# Patient Record
Sex: Male | Born: 1975 | Race: White | Hispanic: Yes | Marital: Married | Smoking: Former smoker
Health system: Southern US, Community
[De-identification: ages and names within clinical notes are randomized; demographics above are authoritative.]

## PROBLEM LIST (undated history)

## (undated) DIAGNOSIS — M109 Gout, unspecified: Secondary | ICD-10-CM

## (undated) DIAGNOSIS — F411 Generalized anxiety disorder: Secondary | ICD-10-CM

## (undated) DIAGNOSIS — E785 Hyperlipidemia, unspecified: Secondary | ICD-10-CM

## (undated) HISTORY — DX: Hyperlipidemia, unspecified: E78.5

## (undated) HISTORY — DX: Generalized anxiety disorder: F41.1

## (undated) HISTORY — DX: Gout, unspecified: M10.9

---

## 1999-11-27 HISTORY — PX: NASAL RECONSTRUCTION: SHX2069

## 2021-03-06 ENCOUNTER — Other Ambulatory Visit: Payer: Self-pay

## 2021-03-06 ENCOUNTER — Ambulatory Visit (INDEPENDENT_AMBULATORY_CARE_PROVIDER_SITE_OTHER): Payer: BC Managed Care – PPO | Admitting: Family Medicine

## 2021-03-06 ENCOUNTER — Encounter: Payer: Self-pay | Admitting: Family Medicine

## 2021-03-06 VITALS — BP 128/84 | HR 59 | Temp 98.4°F | Resp 16 | Ht 76.0 in | Wt 270.0 lb

## 2021-03-06 DIAGNOSIS — Z1211 Encounter for screening for malignant neoplasm of colon: Secondary | ICD-10-CM

## 2021-03-06 DIAGNOSIS — F411 Generalized anxiety disorder: Secondary | ICD-10-CM | POA: Diagnosis not present

## 2021-03-06 DIAGNOSIS — M109 Gout, unspecified: Secondary | ICD-10-CM | POA: Insufficient documentation

## 2021-03-06 DIAGNOSIS — F321 Major depressive disorder, single episode, moderate: Secondary | ICD-10-CM | POA: Diagnosis not present

## 2021-03-06 DIAGNOSIS — L409 Psoriasis, unspecified: Secondary | ICD-10-CM | POA: Diagnosis not present

## 2021-03-06 MED ORDER — TRIAMCINOLONE ACETONIDE 0.1 % EX CREA: 1.0000 "application " | TOPICAL_CREAM | Freq: Two times a day (BID) | CUTANEOUS | 0 refills | Status: DC

## 2021-03-06 MED ORDER — ESCITALOPRAM OXALATE 10 MG PO TABS: 10.0000 mg | ORAL_TABLET | Freq: Every day | ORAL | 3 refills | Status: DC

## 2021-03-06 NOTE — Patient Instructions (Addendum)
If you do not hear anything about your referral in the next 1-2 weeks, call our office and ask for an update.  Please consider counseling. Contact [phone] to schedule an appointment or inquire about cost/insurance coverage.  Aim to do some physical exertion for 150 minutes per week. This is typically divided into 5 days per week, 30 minutes per day. The activity should be enough to get your heart rate up. Anything is better than nothing if you have time constraints.  Coping skills Choose 5 that work for you:  Take a deep breath  Count to 20  Read a book  Do a puzzle  Meditate  Bake  Sing  Knit  Garden   Pray  Go outside  Call a friend  Listen to music  Take a walk  Color  Send a note   Take a bath  Watch a movie  Be alone in a quiet place  Pet an animal  Visit a friend  Journal  Exercise  Stretch

## 2021-03-06 NOTE — Progress Notes (Signed)
Chief Complaint  Patient presents with  . New Patient (Initial Visit)    Here to stablish        New Patient Visit SUBJECTIVE: HPI: Raymond Gamble is an 45 y.o.male who is being seen for establishing care.  The patient was previously seen in [place].  Anxiety/depression The patient has a longstanding history of anxiety/depression.  He has never been on any medication in the past.  He moved from [place] around 1 year ago.  His job is much less stressful.  He is not currently following with a psychologist but plans on starting.  His wife is a Engineer, water.  Exercise helps him tremendously.  Due to the diagnosis though, he has not felt motivated to exercise routinely.  He is the heaviest he has ever been.  He denies any suicidal or homicidal ideation.  No self-medication.  Past Medical History:  Diagnosis Date  . GAD (generalized anxiety disorder)   . Gout    Past Surgical History:  Procedure Laterality Date  . NASAL RECONSTRUCTION  2001   Family History  Problem Relation Age of Onset  . Depression Neg Hx    No Known Allergies  Current Outpatient Medications:  .  Ascorbic Acid (VITAMIN C) 1000 MG tablet, Take 1,000 mg by mouth daily., Disp: , Rfl:  .  Cholecalciferol (D3 ADULT PO), Take by mouth., Disp: , Rfl:  .  escitalopram (LEXAPRO) 10 MG tablet, Take 1 tablet (10 mg total) by mouth daily., Disp: 30 tablet, Rfl: 3 .  magnesium 30 MG tablet, Take 30 mg by mouth 2 (two) times daily., Disp: , Rfl:  .  Multiple Vitamin (MULTIVITAMIN) tablet, Take 1 tablet by mouth daily., Disp: , Rfl:   OBJECTIVE: BP 128/84 (BP Location: Right Arm, Patient Position: Sitting, Cuff Size: Large)   Pulse (!) 59   Temp 98.4 F (36.9 C) (Oral)   Resp 16   Ht 6\' 4"  (1.93 m)   Wt 270 lb (122.5 kg)   SpO2 98%   BMI 32.87 kg/m  General:  well developed, well nourished, in no apparent distress Skin: + Scaly patches of the wrist and behind the ears with erythematous base Nose:  nares patent, septum  midline, mucosa normal Throat/Pharynx:  lips and gingiva without lesion; tongue and uvula midline; non-inflamed pharynx; no exudates or postnasal drainage Lungs:  clear to auscultation, breath sounds equal bilaterally, no respiratory distress Cardio:  regular rate and rhythm, no LE edema or bruits Neuro:  gait normal Psych: well oriented with normal range of affect and appropriate judgment/insight  ASSESSMENT/PLAN: GAD (generalized anxiety disorder) - Plan: escitalopram (LEXAPRO) 10 MG tablet  Psoriasis - Plan: Ambulatory referral to Dermatology  Depression, major, single episode, moderate ([place]) - Plan: escitalopram (LEXAPRO) 10 MG tablet  Screen for colon cancer - Plan: Ambulatory referral to Gastroenterology  1/3.  Start Lexapro 10 mg daily.  Counseled on exercise.  Anxiety coping techniques verbalized and written down.  Behavioral health information verbalized and written down. 2.  He declined any topical medicines today.  Refer to dermatology for other therapies. Patient should return in 6 weeks for physical and to recheck 1/3. The patient voiced understanding and agreement to the plan.   [place], DO 03/06/21  4:48 PM

## 2021-04-19 ENCOUNTER — Other Ambulatory Visit: Payer: Self-pay | Admitting: Family Medicine

## 2021-04-19 ENCOUNTER — Ambulatory Visit (INDEPENDENT_AMBULATORY_CARE_PROVIDER_SITE_OTHER): Payer: BC Managed Care – PPO | Admitting: Family Medicine

## 2021-04-19 ENCOUNTER — Telehealth: Payer: Self-pay | Admitting: Family Medicine

## 2021-04-19 ENCOUNTER — Encounter: Payer: Self-pay | Admitting: Family Medicine

## 2021-04-19 ENCOUNTER — Other Ambulatory Visit (INDEPENDENT_AMBULATORY_CARE_PROVIDER_SITE_OTHER): Payer: BC Managed Care – PPO

## 2021-04-19 ENCOUNTER — Other Ambulatory Visit: Payer: Self-pay

## 2021-04-19 VITALS — BP 106/64 | HR 57 | Temp 98.1°F | Ht 76.0 in | Wt 277.0 lb

## 2021-04-19 DIAGNOSIS — Z0000 Encounter for general adult medical examination without abnormal findings: Secondary | ICD-10-CM | POA: Diagnosis not present

## 2021-04-19 DIAGNOSIS — Z125 Encounter for screening for malignant neoplasm of prostate: Secondary | ICD-10-CM | POA: Diagnosis not present

## 2021-04-19 DIAGNOSIS — F321 Major depressive disorder, single episode, moderate: Secondary | ICD-10-CM

## 2021-04-19 DIAGNOSIS — F411 Generalized anxiety disorder: Secondary | ICD-10-CM

## 2021-04-19 DIAGNOSIS — Z23 Encounter for immunization: Secondary | ICD-10-CM | POA: Diagnosis not present

## 2021-04-19 DIAGNOSIS — Z1159 Encounter for screening for other viral diseases: Secondary | ICD-10-CM | POA: Diagnosis not present

## 2021-04-19 DIAGNOSIS — E785 Hyperlipidemia, unspecified: Secondary | ICD-10-CM

## 2021-04-19 DIAGNOSIS — R739 Hyperglycemia, unspecified: Secondary | ICD-10-CM | POA: Diagnosis not present

## 2021-04-19 DIAGNOSIS — R748 Abnormal levels of other serum enzymes: Secondary | ICD-10-CM

## 2021-04-19 LAB — CBC
HCT: 44.2 % (ref 39.0–52.0)
Hemoglobin: 15.6 g/dL (ref 13.0–17.0)
MCHC: 35.3 g/dL (ref 30.0–36.0)
MCV: 89.1 fl (ref 78.0–100.0)
Platelets: 211 10*3/uL (ref 150.0–400.0)
RBC: 4.96 Mil/uL (ref 4.22–5.81)
RDW: 14.7 % (ref 11.5–15.5)
WBC: 4.8 10*3/uL (ref 4.0–10.5)

## 2021-04-19 LAB — LIPID PANEL
Cholesterol: 233 mg/dL — ABNORMAL HIGH (ref 0–200)
HDL: 29.6 mg/dL — ABNORMAL LOW (ref >39.00)
NonHDL: 203.86
Total CHOL/HDL Ratio: 8
Triglycerides: 388 mg/dL — ABNORMAL HIGH (ref 0.0–149.0)
VLDL: 77.6 mg/dL — ABNORMAL HIGH (ref 0.0–40.0)

## 2021-04-19 LAB — COMPREHENSIVE METABOLIC PANEL
ALT: 54 U/L — ABNORMAL HIGH (ref 0–53)
AST: 26 U/L (ref 0–37)
Albumin: 4.4 g/dL (ref 3.5–5.2)
Alkaline Phosphatase: 54 U/L (ref 39–117)
BUN: 14 mg/dL (ref 6–23)
CO2: 27 mEq/L (ref 19–32)
Calcium: 9.3 mg/dL (ref 8.4–10.5)
Chloride: 104 mEq/L (ref 96–112)
Creatinine, Ser: 1.07 mg/dL (ref 0.40–1.50)
GFR: 83.98 mL/min (ref >60.00)
Glucose, Bld: 113 mg/dL — ABNORMAL HIGH (ref 70–99)
Potassium: 4.3 mEq/L (ref 3.5–5.1)
Sodium: 138 mEq/L (ref 135–145)
Total Bilirubin: 0.6 mg/dL (ref 0.2–1.2)
Total Protein: 6.6 g/dL (ref 6.0–8.3)

## 2021-04-19 LAB — LDL CHOLESTEROL, DIRECT: Direct LDL: 144 mg/dL

## 2021-04-19 LAB — HEMOGLOBIN A1C: Hgb A1c MFr Bld: 5.2 % (ref 4.6–6.5)

## 2021-04-19 MED ORDER — ESCITALOPRAM OXALATE 10 MG PO TABS: 10.0000 mg | ORAL_TABLET | Freq: Every day | ORAL | 2 refills | Status: DC

## 2021-04-19 NOTE — Telephone Encounter (Signed)
See result notes. 

## 2021-04-19 NOTE — Progress Notes (Signed)
Chief Complaint  Patient presents with  . Annual Exam    Well Male Raymond Gamble is here for a complete physical.   His last physical was >1 year ago.  Current diet: in general, diet is fair.  "Dirty keto" diet.  Current exercise: cardio, lifting Weight trend: has increased Fatigue out of ordinary? No.  Seat belt? Yes.    Health maintenance  Tetanus- No HIV- Yes Hep C- No  Past Medical History:  Diagnosis Date  . GAD (generalized anxiety disorder)   . Gout      Past Surgical History:  Procedure Laterality Date  . NASAL RECONSTRUCTION  2001    Medications  Current Outpatient Medications on File Prior to Visit  Medication Sig Dispense Refill  . Cholecalciferol (D3 ADULT PO) Take by mouth.    . escitalopram (LEXAPRO) 10 MG tablet Take 1 tablet (10 mg total) by mouth daily. 30 tablet 3  . magnesium 30 MG tablet Take 30 mg by mouth 2 (two) times daily.    . Multiple Vitamin (MULTIVITAMIN) tablet Take 1 tablet by mouth daily.     Allergies No Known Allergies  Family History Family History  Problem Relation Age of Onset  . Depression Neg Hx     Review of Systems: Constitutional: no fevers or chills Eye:  no recent significant change in vision Ear/Nose/Mouth/Throat:  Ears:  no hearing loss Nose/Mouth/Throat:  no complaints of nasal congestion, no sore throat Cardiovascular:  no chest pain Respiratory:  no shortness of breath Gastrointestinal:  no abdominal pain, no change in bowel habits GU:  Male: negative for dysuria, frequency, and incontinence Musculoskeletal/Extremities:  no pain of the joints Integumentary (Skin/Breast):  no abnormal skin lesions reported Neurologic:  no headaches Endocrine: No unexpected weight changes Hematologic/Lymphatic:  no night sweats  Exam BP 106/64 (BP Location: Left Arm, Patient Position: Sitting, Cuff Size: Large)   Pulse (!) 57   Temp 98.1 F (36.7 C) (Oral)   Ht 6\' 4"  (1.93 m)   Wt 277 lb (125.6 kg)   SpO2 96%   BMI  33.72 kg/m  General:  well developed, well nourished, in no apparent distress Skin:  no significant moles, warts, or growths Head:  no masses, lesions, or tenderness Eyes:  pupils equal and round, sclera anicteric without injection Ears:  canals without lesions, TMs shiny without retraction, no obvious effusion, no erythema Nose:  nares patent, septum midline, mucosa normal Throat/Pharynx:  lips and gingiva without lesion; tongue and uvula midline; non-inflamed pharynx; no exudates or postnasal drainage Neck: neck supple without adenopathy, thyromegaly, or masses Lungs:  clear to auscultation, breath sounds equal bilaterally, no respiratory distress Cardio:  regular rate and rhythm, no bruits, no LE edema Abdomen:  abdomen soft, nontender; bowel sounds normal; no masses or organomegaly Rectal: Deferred Musculoskeletal:  symmetrical muscle groups noted without atrophy or deformity Extremities:  no clubbing, cyanosis, or edema, no deformities, no skin discoloration Neuro:  gait normal; deep tendon reflexes normal and symmetric Psych: well oriented with normal range of affect and appropriate judgment/insight  Assessment and Plan  Well adult exam - Plan: CBC, Comprehensive metabolic panel, Lipid panel  Encounter for hepatitis C screening test for low risk patient - Plan: Hepatitis C antibody  Screening for malignant neoplasm of prostate  Depression, major, single episode, moderate ([facility]) - Plan: escitalopram (LEXAPRO) 10 MG tablet  GAD (generalized anxiety disorder) - Plan: escitalopram (LEXAPRO) 10 MG tablet  Need for Tdap vaccination - Plan: Tdap vaccine greater than or equal  to 35yo IM   Well 45 y.o. male. Counseled on diet and exercise. Counseled on risks and benefits of prostate cancer screening with PSA. The patient agrees to forego screening.  Other orders as above. Doing well on Lexapro, will cont current dosing at 10 mg/d.  Follow up in 6 mo pending the above workup. The  patient voiced understanding and agreement to the plan.  Adrian, DO 04/19/21 9:51 AM

## 2021-04-19 NOTE — Telephone Encounter (Signed)
Patient would like lab results.

## 2021-04-19 NOTE — Patient Instructions (Addendum)
Give [place] 2-3 business days to get the results of your labs back.   Keep the diet clean and stay active.  If you do not hear anything about your referral in the next 1-2 weeks, call our office and ask for an update.  Dermatology team: [phone]  GI team: [phone]  Please consider counseling. Contact [phone] to schedule an appointment or inquire about cost/insurance coverage.   Foods that may reduce pain: 1) Ginger 2) Blueberries 3) Salmon 4) Pumpkin seeds 5) dark chocolate 6) turmeric 7) tart cherries 8) virgin olive oil 9) chilli peppers 10) mint 11) red wine  Let us know if you need anything.

## 2021-04-20 LAB — HEPATITIS C ANTIBODY
Hepatitis C Ab: NONREACTIVE
SIGNAL TO CUT-OFF: 0.02 (ref <1.00)

## 2021-05-17 ENCOUNTER — Other Ambulatory Visit (INDEPENDENT_AMBULATORY_CARE_PROVIDER_SITE_OTHER): Payer: BC Managed Care – PPO

## 2021-05-17 ENCOUNTER — Other Ambulatory Visit: Payer: Self-pay

## 2021-05-17 DIAGNOSIS — E785 Hyperlipidemia, unspecified: Secondary | ICD-10-CM | POA: Diagnosis not present

## 2021-05-17 DIAGNOSIS — R748 Abnormal levels of other serum enzymes: Secondary | ICD-10-CM | POA: Diagnosis not present

## 2021-05-17 LAB — COMPREHENSIVE METABOLIC PANEL
ALT: 48 U/L (ref 0–53)
AST: 27 U/L (ref 0–37)
Albumin: 4.6 g/dL (ref 3.5–5.2)
Alkaline Phosphatase: 55 U/L (ref 39–117)
BUN: 18 mg/dL (ref 6–23)
CO2: 29 mEq/L (ref 19–32)
Calcium: 9.7 mg/dL (ref 8.4–10.5)
Chloride: 102 mEq/L (ref 96–112)
Creatinine, Ser: 1.17 mg/dL (ref 0.40–1.50)
GFR: 75.4 mL/min (ref >60.00)
Glucose, Bld: 109 mg/dL — ABNORMAL HIGH (ref 70–99)
Potassium: 4.4 mEq/L (ref 3.5–5.1)
Sodium: 138 mEq/L (ref 135–145)
Total Bilirubin: 0.7 mg/dL (ref 0.2–1.2)
Total Protein: 7.1 g/dL (ref 6.0–8.3)

## 2021-05-17 LAB — LIPID PANEL
Cholesterol: 238 mg/dL — ABNORMAL HIGH (ref 0–200)
HDL: 38.7 mg/dL — ABNORMAL LOW (ref >39.00)
NonHDL: 199.44
Total CHOL/HDL Ratio: 6
Triglycerides: 354 mg/dL — ABNORMAL HIGH (ref 0.0–149.0)
VLDL: 70.8 mg/dL — ABNORMAL HIGH (ref 0.0–40.0)

## 2021-05-17 LAB — LDL CHOLESTEROL, DIRECT: Direct LDL: 151 mg/dL

## 2021-05-24 ENCOUNTER — Other Ambulatory Visit: Payer: Self-pay | Admitting: Family Medicine

## 2021-05-24 DIAGNOSIS — E785 Hyperlipidemia, unspecified: Secondary | ICD-10-CM

## 2021-05-24 NOTE — Progress Notes (Signed)
lipid

## 2021-06-23 ENCOUNTER — Other Ambulatory Visit: Payer: Self-pay

## 2021-06-23 ENCOUNTER — Telehealth: Payer: Self-pay | Admitting: *Deleted

## 2021-06-23 ENCOUNTER — Ambulatory Visit (AMBULATORY_SURGERY_CENTER): Payer: BC Managed Care – PPO | Admitting: *Deleted

## 2021-06-23 VITALS — Ht 76.0 in | Wt 270.0 lb

## 2021-06-23 DIAGNOSIS — Z1211 Encounter for screening for malignant neoplasm of colon: Secondary | ICD-10-CM

## 2021-06-23 MED ORDER — PLENVU 140 G PO SOLR: 1.0000 | Freq: Once | ORAL | 0 refills | Status: AC

## 2021-06-23 NOTE — Telephone Encounter (Signed)
Unable to reach patient for virtual pre visit. Message left asking for a return phone call to avoid cancellation of up-coming colonoscopy.

## 2021-06-23 NOTE — Progress Notes (Signed)
Virtual pre visit completed.  Instructions sent through [place] and secure e-mail   [email or web address]  No egg or soy allergy known to patient  No issues with past sedation with any surgeries or procedures Patient denies ever being told they had issues or difficulty with intubation  No FH of Malignant Hyperthermia No diet pills per patient No home 02 use per patient  No blood thinners per patient  Pt denies issues with constipation  No A fib or A flutter  EMMI video to pt or via [place] 19 guidelines implemented in [place] today with Pt and RN   Due to the COVID-19 pandemic we are asking patients to follow certain guidelines.  Pt aware of COVID protocols and [facility] guidelines

## 2021-07-03 ENCOUNTER — Other Ambulatory Visit: Payer: Self-pay | Admitting: Family Medicine

## 2021-07-03 MED ORDER — ESCITALOPRAM OXALATE 20 MG PO TABS: 20.0000 mg | ORAL_TABLET | Freq: Every day | ORAL | 2 refills | Status: DC

## 2021-07-06 ENCOUNTER — Encounter: Payer: Self-pay | Admitting: Certified Registered Nurse Anesthetist

## 2021-07-07 ENCOUNTER — Other Ambulatory Visit: Payer: Self-pay

## 2021-07-07 ENCOUNTER — Ambulatory Visit (AMBULATORY_SURGERY_CENTER): Payer: BC Managed Care – PPO | Admitting: Gastroenterology

## 2021-07-07 ENCOUNTER — Encounter: Payer: Self-pay | Admitting: Gastroenterology

## 2021-07-07 VITALS — BP 115/60 | HR 53 | Temp 98.7°F | Resp 17 | Ht 76.0 in | Wt 270.0 lb

## 2021-07-07 DIAGNOSIS — K635 Polyp of colon: Secondary | ICD-10-CM

## 2021-07-07 DIAGNOSIS — Z1211 Encounter for screening for malignant neoplasm of colon: Secondary | ICD-10-CM

## 2021-07-07 DIAGNOSIS — D122 Benign neoplasm of ascending colon: Secondary | ICD-10-CM

## 2021-07-07 MED ORDER — SODIUM CHLORIDE 0.9 % IV SOLN: 500.0000 mL | Freq: Once | INTRAVENOUS | Status: DC

## 2021-07-07 NOTE — Patient Instructions (Signed)
Read all of the handouts given to you by your recovery room nurse. ? ?YOU HAD AN ENDOSCOPIC PROCEDURE TODAY AT THE [facility]:   Refer to the procedure report that was given to you for any specific questions about what was found during the examination.  If the procedure report does not answer your questions, please call your gastroenterologist to clarify.  If you requested that your care partner not be given the details of your procedure findings, then the procedure report has been included in a sealed envelope for you to review at your convenience later. ? ?YOU SHOULD EXPECT: Some feelings of bloating in the abdomen. Passage of more gas than usual.  Walking can help get rid of the air that was put into your GI tract during the procedure and reduce the bloating. If you had a lower endoscopy (such as a colonoscopy or flexible sigmoidoscopy) you may notice spotting of blood in your stool or on the toilet paper. If you underwent a bowel prep for your procedure, you may not have a normal bowel movement for a few days. ? ?Please Note:  You might notice some irritation and congestion in your nose or some drainage.  This is from the oxygen used during your procedure.  There is no need for concern and it should clear up in a day or so. ? ?SYMPTOMS TO REPORT IMMEDIATELY: ? ?Following lower endoscopy (colonoscopy or flexible sigmoidoscopy): ? Excessive amounts of blood in the stool ? Significant tenderness or worsening of abdominal pains ? Swelling of the abdomen that is new, acute ? Fever of 100?F or higher ? ?  ?For urgent or emergent issues, a gastroenterologist can be reached at any hour by calling [phone]. ?Do not use MyChart messaging for urgent concerns.  ? ? ?DIET:  We do recommend a small meal at first, but then you may proceed to your regular diet.  Drink plenty of fluids but you should avoid alcoholic beverages for 24 hours. ? ?ACTIVITY:  You should plan to take it easy for the rest of today  and you should NOT DRIVE or use heavy machinery until tomorrow (because of the sedation medicines used during the test).   ? ?FOLLOW UP: ?Our staff will call the number listed on your records 48-72 hours following your procedure to check on you and address any questions or concerns that you may have regarding the information given to you following your procedure. If we do not reach you, we will leave a message.  We will attempt to reach you two times.  During this call, we will ask if you have developed any symptoms of COVID 19. If you develop any symptoms (ie: fever, flu-like symptoms, shortness of breath, cough etc.) before then, please call [phone].  If you test positive for Covid 19 in the 2 weeks post procedure, please call and report this information to us.   ? ?If any biopsies were taken you will be contacted by phone or by letter within the next 1-3 weeks.  Please call us at [phone] if you have not heard about the biopsies in 3 weeks.  ? ? ?SIGNATURES/CONFIDENTIALITY: ?You and/or your care partner have signed paperwork which will be entered into your electronic medical record.  These signatures attest to the fact that that the information above on your After Visit Summary has been reviewed and is understood.  Full responsibility of the confidentiality of this discharge information lies with you and/or your care-partner.  ?

## 2021-07-07 NOTE — Progress Notes (Signed)
Report given to PACU, vss 

## 2021-07-07 NOTE — Progress Notes (Signed)
Pt's states no medical or surgical changes since previsit or office visit. 

## 2021-07-07 NOTE — Progress Notes (Signed)
Called to room to assist during endoscopic procedure.  Patient ID and intended procedure confirmed with present staff. Received instructions for my participation in the procedure from the performing physician.  

## 2021-07-07 NOTE — Op Note (Signed)
[facility] Patient Name: Raymond Gamble Procedure Date: 07/07/2021 2:03 PM MRN: [id number] Endoscopist: Mallie Mussel L. Loletha Carrow , MD Age: 45 Referring MD:  Date of Birth: Sep 11, 1976 Gender: Male Account #: [id number] Procedure:                Colonoscopy Indications:              Screening for colorectal malignant neoplasm, This                            is the patient's first colonoscopy Medicines:                Monitored Anesthesia Care Procedure:                Pre-Anesthesia Assessment:                           - Prior to the procedure, a History and Physical                            was performed, and patient medications and                            allergies were reviewed. The patient's tolerance of                            previous anesthesia was also reviewed. The risks                            and benefits of the procedure and the sedation                            options and risks were discussed with the patient.                            All questions were answered, and informed consent                            was obtained. Prior Anticoagulants: The patient has                            taken no previous anticoagulant or antiplatelet                            agents. ASA Grade Assessment: II - A patient with                            mild systemic disease. After reviewing the risks                            and benefits, the patient was deemed in                            satisfactory condition to undergo the procedure.  After obtaining informed consent, the colonoscope                            was passed under direct vision. Throughout the                            procedure, the patient's blood pressure, pulse, and                            oxygen saturations were monitored continuously. The                            Olympus CF-HQ190L (Serial# [id number]) Colonoscope was                            introduced through the anus  and advanced to the the                            cecum, identified by appendiceal orifice and                            ileocecal valve. The colonoscopy was performed                            without difficulty. The patient tolerated the                            procedure well. The quality of the bowel                            preparation was excellent. The ileocecal valve,                            appendiceal orifice, and rectum were photographed.                            The bowel preparation used was Plenvu. Scope In: 2:21:01 PM Scope Out: 2:35:48 PM Scope Withdrawal Time: 0 hours 12 minutes 14 seconds  Total Procedure Duration: 0 hours 14 minutes 47 seconds  Findings:                 The perianal and digital rectal examinations were                            normal.                           A 10 mm polyp was found in the ascending colon. The                            polyp was semi-sessile. The polyp was removed with                            a cold snare. Resection and retrieval were complete.  The exam was otherwise without abnormality on                            direct and retroflexion views. Complications:            No immediate complications. Estimated Blood Loss:     Estimated blood loss was minimal. Impression:               - One 10 mm polyp in the ascending colon, removed                            with a cold snare. Resected and retrieved.                           - The examination was otherwise normal on direct                            and retroflexion views. Recommendation:           - Patient has a contact number available for                            emergencies. The signs and symptoms of potential                            delayed complications were discussed with the                            patient. Return to normal activities tomorrow.                            Written discharge instructions were provided to the                             patient.                           - Resume previous diet.                           - Continue present medications.                           - Await pathology results.                           - Repeat colonoscopy is recommended for                            surveillance. The colonoscopy date will be                            determined after pathology results from today's                            exam become available for review. Deni Berti L. Loletha Carrow, MD 07/07/2021 2:41:21 PM This report has  been signed electronically.

## 2021-07-07 NOTE — Progress Notes (Signed)
History and Physical:  This patient presents for endoscopic testing for: Encounter Diagnosis  Name Primary?   Special screening for malignant neoplasms, colon Yes   Patient feels well today.   Past Medical History: Past Medical History:  Diagnosis Date   GAD (generalized anxiety disorder)    Gout    Hyperlipidemia      Past Surgical History: Past Surgical History:  Procedure Laterality Date   NASAL RECONSTRUCTION  2001    Allergies: No Known Allergies  Outpatient Meds: Current Outpatient Medications  Medication Sig Dispense Refill   Cholecalciferol (D3 ADULT PO) Take by mouth.     escitalopram (LEXAPRO) 20 MG tablet Take 1 tablet (20 mg total) by mouth daily. 90 tablet 2   magnesium 30 MG tablet Take 30 mg by mouth 2 (two) times daily.     Multiple Vitamin (MULTIVITAMIN) tablet Take 1 tablet by mouth daily.     Current Facility-Administered Medications  Medication Dose Route Frequency Provider Last Rate Last Admin   0.9 %  sodium chloride infusion  500 mL Intravenous Once Danis, Estill Cotta III, MD          ___________________________________________________________________ Objective   Exam:  BP (!) 135/92   Pulse (!) 56   Temp 98.7 F (37.1 C) (Temporal)   Resp 12   Ht '6\' 4"'$  (1.93 m)   Wt 270 lb (122.5 kg)   SpO2 96%   BMI 32.87 kg/m   CV: RRR without murmur, S1/S2 Resp: clear to auscultation bilaterally, normal RR and effort noted GI: soft, no tenderness, with active bowel sounds.   Assessment: Encounter Diagnosis  Name Primary?   Special screening for malignant neoplasms, colon Yes     Plan: Colonoscopy   The patient is appropriate for an endoscopic procedure in the ambulatory setting.   - Wilfrid Lund, MD

## 2021-07-11 ENCOUNTER — Telehealth: Payer: Self-pay | Admitting: *Deleted

## 2021-07-11 NOTE — Telephone Encounter (Signed)
  Follow up Call-  Call back number 07/07/2021  Post procedure Call Back phone  # [phone]  Permission to leave phone message Yes     Patient questions: Message left to call if necessary.

## 2021-07-18 ENCOUNTER — Encounter: Payer: Self-pay | Admitting: Gastroenterology

## 2021-07-20 ENCOUNTER — Other Ambulatory Visit: Payer: Self-pay | Admitting: Family Medicine

## 2021-07-28 ENCOUNTER — Emergency Department (INDEPENDENT_AMBULATORY_CARE_PROVIDER_SITE_OTHER): Payer: BC Managed Care – PPO

## 2021-07-28 ENCOUNTER — Other Ambulatory Visit: Payer: Self-pay

## 2021-07-28 ENCOUNTER — Emergency Department (INDEPENDENT_AMBULATORY_CARE_PROVIDER_SITE_OTHER)
Admission: RE | Admit: 2021-07-28 | Discharge: 2021-07-28 | Disposition: A | Discharge status: Home / Self Care | Payer: Self-pay | Source: Ambulatory Visit | Attending: Family Medicine | Admitting: Family Medicine

## 2021-07-28 DIAGNOSIS — M542 Cervicalgia: Secondary | ICD-10-CM | POA: Diagnosis not present

## 2021-07-28 DIAGNOSIS — S161XXA Strain of muscle, fascia and tendon at neck level, initial encounter: Secondary | ICD-10-CM
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
## 2021-07-28 NOTE — Discharge Instructions (Signed)
Apply ice pack for 20 to 30 minutes, 3 to 4 times daily  Continue until pain decreases.  May take Ibuprofen '200mg'$ , 4 tabs every 8 hours with food for 3 to 5 days.  May take Tylenol as needed for pain. Begin neck range of motion and stretching exercises as tolerated.

## 2021-10-04 ENCOUNTER — Encounter: Payer: Self-pay | Admitting: Family Medicine

## 2021-10-04 ENCOUNTER — Ambulatory Visit (INDEPENDENT_AMBULATORY_CARE_PROVIDER_SITE_OTHER): Payer: BC Managed Care – PPO | Admitting: Family Medicine

## 2021-10-04 ENCOUNTER — Other Ambulatory Visit: Payer: Self-pay

## 2021-10-04 VITALS — BP 132/88 | HR 64 | Temp 98.8°F | Ht 76.0 in | Wt 273.1 lb

## 2021-10-04 DIAGNOSIS — R252 Cramp and spasm: Secondary | ICD-10-CM

## 2021-10-04 DIAGNOSIS — R002 Palpitations: Secondary | ICD-10-CM

## 2021-10-04 DIAGNOSIS — Z23 Encounter for immunization: Secondary | ICD-10-CM

## 2021-10-04 DIAGNOSIS — J342 Deviated nasal septum: Secondary | ICD-10-CM | POA: Diagnosis not present

## 2021-10-04 NOTE — Progress Notes (Signed)
Chief Complaint  Patient presents with   Follow-up    Dermatology appointment Sob cramps    Subjective: Patient is a 45 y.o. male here for f/u.  The patient has a history of psoriasis.  He went to a dermatologist who drew blood work and told him his liver labs were low and he should follow-up with his PCP.  He has not had a drink of alcohol in the last 4 months.  No known history of fatty liver.  He has had high cholesterol readings in the past.  Patient has a history of a deviated nasal septum.  He is requesting to see an ENT specialist.  Over the past several months, the patient will have intermittent episodes of fluttering in his chest.  It last for several seconds before spontaneously resolving.  He will have a few episodes per month.  He has associated shortness of breath but no dizziness/lightheadedness or chest pain.  He does have a remote history of cocaine use and enlarged heart.  Patient continues to have cramping.  It happens in his neck and calves, usually in the morning.  He has made a conscious effort to increase his water intake over the past couple days.  He also started taking a magnesium supplement.  He drinks a lot of pickle juice.  Past Medical History:  Diagnosis Date   GAD (generalized anxiety disorder)    Gout    Hyperlipidemia     Objective: BP 132/88   Pulse 64   Temp 98.8 F (37.1 C) (Oral)   Ht 6\' 4"  (1.93 m)   Wt 273 lb 2 oz (123.9 kg)   SpO2 99%   BMI 33.25 kg/m  General: Awake, appears stated age Heart: RRR, no LE edema Lungs: CTAB, no rales, wheezes or rhonchi. No accessory muscle use Psych: Age appropriate judgment and insight, normal affect and mood  Assessment and Plan: Deviated nasal septum - Plan: Ambulatory referral to ENT  Palpitations - Plan: CBC, Comprehensive metabolic panel, TSH, Ambulatory referral to Cardiology  Leg cramping - Plan: Magnesium  Need for influenza vaccination - Plan: Flu Vaccine QUAD 6+ mos PF IM (Fluarix Quad  PF)  Refer ENT Check labs, which will also encompass liver testing, refer to cardiology.  Will defer to their judgment what kind of imaging he would require, if any, regarding history of enlarged heart and cocaine abuse.  He is questioning whether he needs a stress test. Stay hydrated, okay to continue magnesium supplement.  Continue pickle juice usage.  Check labs.  If he still having issues despite doing well with reduced and being adequately hydrated, would consider nightly baclofen dose. The patient voiced understanding and agreement to the plan.  [place], DO 10/04/21  4:41 PM

## 2021-10-04 NOTE — Patient Instructions (Signed)
Give [place] 2-3 business days to get the results of your labs back.   Stay hydrated.   If you do not hear anything about your referral in the next 1-2 weeks, call our office and ask for an update.  Let us know if you need anything.

## 2021-10-05 ENCOUNTER — Other Ambulatory Visit (INDEPENDENT_AMBULATORY_CARE_PROVIDER_SITE_OTHER): Payer: BC Managed Care – PPO

## 2021-10-05 DIAGNOSIS — E785 Hyperlipidemia, unspecified: Secondary | ICD-10-CM

## 2021-10-05 LAB — LIPID PANEL
Cholesterol: 243 mg/dL — ABNORMAL HIGH (ref 0–200)
HDL: 38.5 mg/dL — ABNORMAL LOW (ref >39.00)
NonHDL: 204.72
Total CHOL/HDL Ratio: 6
Triglycerides: 261 mg/dL — ABNORMAL HIGH (ref 0.0–149.0)
VLDL: 52.2 mg/dL — ABNORMAL HIGH (ref 0.0–40.0)

## 2021-10-05 LAB — LDL CHOLESTEROL, DIRECT: Direct LDL: 163 mg/dL

## 2021-10-06 ENCOUNTER — Other Ambulatory Visit: Payer: Self-pay

## 2021-10-06 ENCOUNTER — Other Ambulatory Visit: Payer: BC Managed Care – PPO

## 2021-10-06 ENCOUNTER — Other Ambulatory Visit (INDEPENDENT_AMBULATORY_CARE_PROVIDER_SITE_OTHER): Payer: BC Managed Care – PPO

## 2021-10-06 DIAGNOSIS — R252 Cramp and spasm: Secondary | ICD-10-CM

## 2021-10-06 DIAGNOSIS — R002 Palpitations: Secondary | ICD-10-CM

## 2021-10-06 NOTE — Addendum Note (Signed)
Addended by: Manuela Schwartz on: 10/06/2021 02:45 PM   Modules accepted: Orders

## 2021-10-06 NOTE — Addendum Note (Signed)
Addended by: Manuela Schwartz on: 10/06/2021 02:47 PM   Modules accepted: Orders

## 2021-10-07 LAB — COMPREHENSIVE METABOLIC PANEL
AG Ratio: 2.1 (calc) (ref 1.0–2.5)
ALT: 48 U/L — ABNORMAL HIGH (ref 9–46)
AST: 23 U/L (ref 10–40)
Albumin: 4.7 g/dL (ref 3.6–5.1)
Alkaline phosphatase (APISO): 59 U/L (ref 36–130)
BUN: 18 mg/dL (ref 7–25)
CO2: 27 mmol/L (ref 20–32)
Calcium: 9.3 mg/dL (ref 8.6–10.3)
Chloride: 101 mmol/L (ref 98–110)
Creat: 1.07 mg/dL (ref 0.60–1.29)
Globulin: 2.2 g/dL (calc) (ref 1.9–3.7)
Glucose, Bld: 91 mg/dL (ref 65–99)
Potassium: 4.1 mmol/L (ref 3.5–5.3)
Sodium: 137 mmol/L (ref 135–146)
Total Bilirubin: 0.7 mg/dL (ref 0.2–1.2)
Total Protein: 6.9 g/dL (ref 6.1–8.1)

## 2021-10-07 LAB — CBC
HCT: 46.3 % (ref 38.5–50.0)
Hemoglobin: 16.3 g/dL (ref 13.2–17.1)
MCH: 32 pg (ref 27.0–33.0)
MCHC: 35.2 g/dL (ref 32.0–36.0)
MCV: 90.8 fL (ref 80.0–100.0)
MPV: 10.7 fL (ref 7.5–12.5)
Platelets: 243 10*3/uL (ref 140–400)
RBC: 5.1 10*6/uL (ref 4.20–5.80)
RDW: 13.8 % (ref 11.0–15.0)
WBC: 6.1 10*3/uL (ref 3.8–10.8)

## 2021-10-07 LAB — TSH: TSH: 1.25 mIU/L (ref 0.40–4.50)

## 2021-10-07 LAB — MAGNESIUM: Magnesium: 2.3 mg/dL (ref 1.5–2.5)

## 2021-10-10 ENCOUNTER — Other Ambulatory Visit: Payer: BC Managed Care – PPO

## 2021-10-16 ENCOUNTER — Ambulatory Visit: Payer: BC Managed Care – PPO | Admitting: Family Medicine

## 2021-11-06 DIAGNOSIS — E785 Hyperlipidemia, unspecified: Secondary | ICD-10-CM | POA: Insufficient documentation

## 2021-11-09 NOTE — Progress Notes (Signed)
Cardiology Office Note:    Date:  11/10/2021   ID:  Raymond Gamble, DOB 1976/04/25, MRN [id number]  PCP:  Shelda Pal, DO  Cardiologist:  Shirlee More, MD   Referring MD: Shelda Pal*  ASSESSMENT:    1. Palpitations   2. Cardiac risk counseling    PLAN:    In order of problems listed above:  Symptoms are not severe but ongoing and will apply a new event monitor for up to 2 weeks to try to document occurrence.  I think the most important issue is whether or not he has underlying heart disease echocardiogram for cardiomyopathy and we will do a coronary artery calcium score and will guide if his statin is appropriate but also he is a high score I would like him to see him undergo an ischemia evaluation.  He asked about a stress test and I told him in the circumstances that generally is not helpful and at that point I would prefer a cardiac CTA.  We can also reestimate his cardiovascular risk as a traditional risk estimates do not include family history or coronary calcium score.  In the interim I asked him to avoid over-the-counter proarrhythmic drugs  Next appointment 4 to 6 weeks   Medication Adjustments/Labs and Tests Ordered: Current medicines are reviewed at length with the patient today.  Concerns regarding medicines are outlined above.  Orders Placed This Encounter  Procedures   CT CARDIAC SCORING (SELF PAY ONLY)   LONG TERM MONITOR (3-14 DAYS)   EKG 12-Lead   ECHOCARDIOGRAM COMPLETE   No orders of the defined types were placed in this encounter.    Chief complaint: Palpitation  he has had intermittent episodes of fluttering in his chest lasting a few seconds  History of Present Illness:    Raymond Gamble is a 45 y.o. male who is being seen today for the evaluation of palpitation at the request of Shelda Pal*.  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 He has a long history of palpitation and its mostly fluttering at times he will feel a pause and be breathless for a second and he also describes it as pulsation.  Its not worsened he has not had syncope. He has no known heart disease congenital rheumatic or documented arrhythmia. He has psoriasis and will be starting a biologic agent soon.  He has significant hyperlipidemia advised to statin and declined Past Medical History:  Diagnosis Date   GAD (generalized anxiety disorder)    Gout    Hyperlipidemia     Past Surgical History:  Procedure Laterality Date   NASAL RECONSTRUCTION  2001    Current Medications: Current Meds  Medication Sig   escitalopram (LEXAPRO) 20 MG tablet Take 1 tablet (20 mg total) by mouth daily.   Multiple Vitamin (MULTIVITAMIN) tablet Take 1 tablet by mouth daily.     Allergies:   Patient has no known allergies.   Social History   Socioeconomic History   Marital status: Married    Spouse name: Not on file   Number of children: 0   Years of education: Not on file   Highest education level: Not on file  Occupational History  Occupation: Health and safety inspector  Tobacco Use   Smoking status: Former    Types: Cigarettes    Quit date: 2001    Years since quitting: 21.9   Smokeless tobacco: Never  Vaping Use   Vaping Use: Never used  Substance and Sexual Activity   Alcohol use: Not Currently   Drug use: Not Currently   Sexual activity: Not on file  Other Topics Concern   Not on file  Social History Narrative   Not on file   Social Determinants of Health   Financial Resource Strain: Not on file  Food Insecurity: Not on file  Transportation Needs: Not on file  Physical Activity: Unknown   Days of Exercise per Week: 0 days    Minutes of Exercise per Session: Not on file  Stress: Not on file  Social Connections: Not on file     Family History: The patient's family history includes Cancer in his mother; Colon cancer in an other family member; Diabetes in his father; Hyperlipidemia in his father; Hypertension in his father; Obesity in his father. There is no history of Depression, Esophageal cancer, Stomach cancer, Rectal cancer, or Colon polyps.  ROS:   ROS Please see the history of present illness.     All other systems reviewed and are negative.  EKGs/Labs/Other Studies Reviewed:    The following studies were reviewed today:   EKG:  EKG is  ordered today.  The ekg ordered today is personally reviewed and demonstrates sinus rhythm normal  Recent Labs: 10/06/2021: ALT 48; BUN 18; Creat 1.07; Hemoglobin 16.3; Magnesium 2.3; Platelets 243; Potassium 4.1; Sodium 137; TSH 1.25  Recent Lipid Panel    Component Value Date/Time   CHOL 243 (H) 10/05/2021 0846   TRIG 261.0 (H) 10/05/2021 0846   HDL 38.50 (L) 10/05/2021 0846   CHOLHDL 6 10/05/2021 0846   VLDL 52.2 (H) 10/05/2021 0846   LDLDIRECT 163.0 10/05/2021 0846    Physical Exam:    VS:  BP 126/84    Pulse 62    Ht 6\' 4"  (1.93 m)    Wt 274 lb (124.3 kg)    SpO2 97%    BMI 33.35 kg/m     Wt Readings from Last 3 Encounters:  11/10/21 274 lb (124.3 kg)  10/04/21 273 lb 2 oz (123.9 kg)  07/28/21 270 lb (122.5 kg)     GEN:  Well nourished, well developed in no acute distress HEENT: Normal NECK: No JVD; No carotid bruits LYMPHATICS: No lymphadenopathy CARDIAC: RRR, no murmurs, rubs, gallops RESPIRATORY:  Clear to auscultation without rales, wheezing or rhonchi  ABDOMEN: Soft, non-tender, non-distended MUSCULOSKELETAL:  No edema; No deformity  SKIN: Warm and dry NEUROLOGIC:  Alert and oriented x 3 PSYCHIATRIC:  Normal affect     Signed, Shirlee More, MD  11/10/2021 9:44 AM    [place]

## 2021-11-10 ENCOUNTER — Other Ambulatory Visit: Payer: Self-pay

## 2021-11-10 ENCOUNTER — Encounter: Payer: Self-pay | Admitting: Cardiology

## 2021-11-10 ENCOUNTER — Ambulatory Visit (INDEPENDENT_AMBULATORY_CARE_PROVIDER_SITE_OTHER): Payer: BC Managed Care – PPO | Admitting: Cardiology

## 2021-11-10 ENCOUNTER — Ambulatory Visit (INDEPENDENT_AMBULATORY_CARE_PROVIDER_SITE_OTHER): Payer: BC Managed Care – PPO

## 2021-11-10 VITALS — BP 126/84 | HR 62 | Ht 76.0 in | Wt 274.0 lb

## 2021-11-10 DIAGNOSIS — Z7189 Other specified counseling: Secondary | ICD-10-CM

## 2021-11-10 DIAGNOSIS — R002 Palpitations: Secondary | ICD-10-CM

## 2021-11-10 NOTE — Patient Instructions (Signed)
1. Avoid all over-the-counter antihistamines except Claritin/Loratadine and Zyrtec/Cetrizine. 2. Avoid all combination including cold sinus allergies flu decongestant and sleep medications 3. You can use Robitussin DM Mucinex and Mucinex DM for cough. 4. can use Tylenol aspirin ibuprofen and naproxen but no combinations such as sleep or sinus.

## 2021-11-10 NOTE — Patient Instructions (Signed)
Medication Instructions:  Your physician recommends that you continue on your current medications as directed. Please refer to the Current Medication list given to you today.  *If you need a refill on your cardiac medications before your next appointment, please call your pharmacy*   Lab Work: None If you have labs (blood work) drawn today and your tests are completely normal, you will receive your results only by: [place] (if you have MyChart) OR A paper copy in the mail If you have any lab test that is abnormal or we need to change your treatment, we will call you to review the results.   Testing/Procedures: Your physician has requested that you have an echocardiogram. Echocardiography is a painless test that uses sound waves to create images of your heart. It provides your doctor with information about the size and shape of your heart and how well your hearts chambers and valves are working. This procedure takes approximately one hour. There are no restrictions for this procedure.  A zio monitor was ordered today. It will remain on for 14 days. You will then return monitor and event diary in provided box. It takes 1-2 weeks for report to be downloaded and returned to [place]. We will call you with the results. If monitor falls off or has orange flashing light, please call Z[employer] for further instructions.   We have placed the order for you to have a CT calcium score completed. They will call you to schedule this appointment.    Follow-Up: At [facility], you and your health needs are our priority.  As part of our continuing mission to provide you with exceptional heart care, we have created designated Provider Care Teams.  These Care Teams include your primary Cardiologist (physician) and Advanced Practice Providers (APPs -  Physician Assistants and Nurse Practitioners) who all work together to provide you with the care you need, when you need it.  We recommend signing up for the  patient portal called "MyChart".  Sign up information is provided on this After Visit Summary.  MyChart is used to connect with patients for Virtual Visits (Telemedicine).  Patients are able to view lab/test results, encounter notes, upcoming appointments, etc.  Non-urgent messages can be sent to your provider as well.   To learn more about what you can do with MyChart, go to [email or web address].    Your next appointment:   6 week(s)  The format for your next appointment:   In Person  Provider:   Shirlee More, MD    Other Instructions

## 2021-12-12 ENCOUNTER — Other Ambulatory Visit: Payer: Self-pay

## 2021-12-12 ENCOUNTER — Ambulatory Visit (INDEPENDENT_AMBULATORY_CARE_PROVIDER_SITE_OTHER)
Admission: RE | Admit: 2021-12-12 | Discharge: 2021-12-12 | Disposition: A | Discharge status: Home / Self Care | Payer: Self-pay | Source: Ambulatory Visit | Attending: Cardiology | Admitting: Cardiology

## 2021-12-12 ENCOUNTER — Encounter: Payer: Self-pay | Admitting: Family Medicine

## 2021-12-12 DIAGNOSIS — Z7189 Other specified counseling: Secondary | ICD-10-CM

## 2021-12-13 ENCOUNTER — Ambulatory Visit (HOSPITAL_BASED_OUTPATIENT_CLINIC_OR_DEPARTMENT_OTHER)
Admission: RE | Admit: 2021-12-13 | Discharge: 2021-12-13 | Disposition: A | Discharge status: Home / Self Care | Payer: BC Managed Care – PPO | Source: Ambulatory Visit | Attending: Cardiology | Admitting: Cardiology

## 2021-12-13 ENCOUNTER — Other Ambulatory Visit: Payer: Self-pay | Admitting: Family Medicine

## 2021-12-13 DIAGNOSIS — I361 Nonrheumatic tricuspid (valve) insufficiency: Secondary | ICD-10-CM | POA: Diagnosis not present

## 2021-12-13 DIAGNOSIS — I340 Nonrheumatic mitral (valve) insufficiency: Secondary | ICD-10-CM | POA: Diagnosis not present

## 2021-12-13 DIAGNOSIS — R002 Palpitations: Secondary | ICD-10-CM | POA: Diagnosis not present

## 2021-12-13 LAB — ECHOCARDIOGRAM COMPLETE
AR max vel: 2.53 cm2
AV Area VTI: 2.37 cm2
AV Area mean vel: 2.43 cm2
AV Mean grad: 5 mmHg
AV Peak grad: 8.1 mmHg
Ao pk vel: 1.42 m/s
Area-P 1/2: 4.8 cm2
S' Lateral: 4 cm

## 2021-12-13 MED ORDER — ROSUVASTATIN CALCIUM 20 MG PO TABS: 20.0000 mg | ORAL_TABLET | Freq: Every day | ORAL | 3 refills | Status: DC

## 2021-12-13 NOTE — Progress Notes (Signed)
°  Echocardiogram 2D Echocardiogram has been performed.  Merrie Roof F 12/13/2021, 8:18 AM

## 2021-12-15 ENCOUNTER — Telehealth: Payer: Self-pay

## 2021-12-15 NOTE — Telephone Encounter (Signed)
-----   Message from Richardo Priest, MD sent at 12/14/2021 12:26 PM EST ----- His echocardiogram is reassuring and normal  His coronary calcium score is intermediate less than 13 less than 100 but greater than 75th percentile at 58 and he should continue high intensity statin.  We can discuss further at office follow-up

## 2021-12-15 NOTE — Telephone Encounter (Signed)
Left message on patients voicemail to please return our call.   

## 2021-12-18 ENCOUNTER — Telehealth: Payer: Self-pay

## 2021-12-18 NOTE — Telephone Encounter (Signed)
Spoke with patient regarding results and recommendation.  Patient verbalizes understanding and is agreeable to plan of care. Advised patient to call back with any issues or concerns.  

## 2021-12-18 NOTE — Telephone Encounter (Signed)
-----   Message from Richardo Priest, MD sent at 12/14/2021 12:26 PM EST ----- His echocardiogram is reassuring and normal  His coronary calcium score is intermediate less than 13 less than 100 but greater than 75th percentile at 51 and he should continue high intensity statin.  We can discuss further at office follow-up

## 2021-12-21 ENCOUNTER — Ambulatory Visit: Payer: BC Managed Care – PPO | Admitting: Cardiology

## 2022-01-03 ENCOUNTER — Encounter: Payer: Self-pay | Admitting: Family Medicine

## 2022-02-27 ENCOUNTER — Other Ambulatory Visit: Payer: Self-pay | Admitting: Family Medicine

## 2022-02-27 MED ORDER — ESCITALOPRAM OXALATE 20 MG PO TABS: 20.0000 mg | ORAL_TABLET | Freq: Every day | ORAL | 1 refills | Status: DC

## 2022-02-27 MED ORDER — ROSUVASTATIN CALCIUM 20 MG PO TABS: 20.0000 mg | ORAL_TABLET | Freq: Every day | ORAL | 1 refills | Status: DC

## 2022-04-17 ENCOUNTER — Other Ambulatory Visit: Payer: Self-pay | Admitting: Family Medicine

## 2022-06-08 ENCOUNTER — Encounter (INDEPENDENT_AMBULATORY_CARE_PROVIDER_SITE_OTHER): Payer: BC Managed Care – PPO | Admitting: Family Medicine

## 2022-06-08 DIAGNOSIS — F411 Generalized anxiety disorder: Secondary | ICD-10-CM | POA: Diagnosis not present

## 2022-06-08 DIAGNOSIS — F321 Major depressive disorder, single episode, moderate: Secondary | ICD-10-CM

## 2022-06-08 DIAGNOSIS — S76012A Strain of muscle, fascia and tendon of left hip, initial encounter: Secondary | ICD-10-CM

## 2022-06-08 MED ORDER — BUPROPION HCL ER (XL) 150 MG PO TB24: 150.0000 mg | ORAL_TABLET | Freq: Every day | ORAL | 1 refills | Status: DC

## 2022-06-08 NOTE — Telephone Encounter (Signed)
Please see the MyChart message reply(ies) for my assessment and plan.  The patient gave consent for this Medical Advice Message and is aware that it may result in a bill to their insurance company as well as the possibility that this may result in a co-payment or deductible. They are an established patient, but are not seeking medical advice exclusively about a problem treated during an in person or video visit in the last 7 days. I did not recommend an in person or video visit within 7 days of my reply.  I spent a total of 6 minutes cumulative time within 7 days through MyChart messaging Karon Heckendorn Paul Hairo Garraway, DO  

## 2022-06-21 ENCOUNTER — Encounter: Payer: Self-pay | Admitting: Family Medicine

## 2022-07-24 ENCOUNTER — Telehealth: Payer: BC Managed Care – PPO

## 2022-07-26 ENCOUNTER — Telehealth: Payer: Self-pay

## 2022-07-26 NOTE — Telephone Encounter (Addendum)
Transition Care Management Unsuccessful Follow-up Telephone Call  Date of discharge and from where:  [place] 07-25-22 Dx: acute cholecystitis  Attempts:  1st Attempt  Reason for unsuccessful TCM follow-up call:  Left voice message  Transition Care Management Unsuccessful Follow-up Telephone Call  Date of discharge and from where: [place] 07-25-22 Dx: acute cholecystitis   Attempts:  2nd Attempt  Reason for unsuccessful TCM follow-up call:  Left voice message  Transition Care Management Unsuccessful Follow-up Telephone Call  Date of discharge and from where:  [place] 07-25-22 Dx: acute cholecystitis  Attempts:  3rd Attempt  Reason for unsuccessful TCM follow-up call:  Left voice message

## 2022-08-07 ENCOUNTER — Other Ambulatory Visit: Payer: Self-pay | Admitting: Family Medicine

## 2022-08-20 ENCOUNTER — Other Ambulatory Visit: Payer: Self-pay | Admitting: Family Medicine

## 2022-09-04 ENCOUNTER — Encounter: Payer: Self-pay | Admitting: Family Medicine

## 2022-09-19 ENCOUNTER — Ambulatory Visit (INDEPENDENT_AMBULATORY_CARE_PROVIDER_SITE_OTHER): Payer: BC Managed Care – PPO | Admitting: Sports Medicine

## 2022-09-19 VITALS — BP 118/82 | HR 68 | Ht 76.0 in | Wt 279.0 lb

## 2022-09-19 DIAGNOSIS — Z8781 Personal history of (healed) traumatic fracture: Secondary | ICD-10-CM

## 2022-09-19 DIAGNOSIS — M65312 Trigger thumb, left thumb: Secondary | ICD-10-CM

## 2022-09-19 NOTE — Progress Notes (Signed)
Raymond Gamble Raymond Gamble Phone: [phone]   Assessment and Plan:     1. Trigger finger of left thumb -Subacute, worsening, initial sports medicine visit - Consistent with trigger finger of left thumb based on HPI and physical exam - Patient elected for CSI.  Tolerated well per note below - Recommend relative protection over the next 2 weeks - Discussed alternatives such as course of prescription strength NSAID, and surgical repair  Procedure: Flexor Tendon Sheath Injection (Trigger Finger) Side: Left 1st digit  Indication: Trigger finger  After explaining the procedure, viable alternatives, risks, and answering any questions, consent was given verbally.  The site was cleaned with alcohol prep.  A steroid injection was performed under ultrasound guidance using 0.22m of 1% lidocaine without epinephrine and 20 mg of Kenalog 40 with sterile technique.  This was well tolerated and resulted in  relief.  Needle was removed and dressing placed and post injection instructions were given including  a discussion of likely return of pain today after the anesthetic wears off (with the possibility of worsened pain) until the steroid starts to work in 3-5 days.   Pt was advised to call or return to clinic if these symptoms worsen or fail to improve as anticipated.      2. History of fracture of nose  -Chronic with exacerbation, initial visit - History of 2 nasal fractures with chronic affected sleep because of breathing patterns - Patient is interested in establishing care with ENT to see if there is a surgical or other option to assist with nasal breathing - Referral to ENT sent  Pertinent previous records reviewed include none   Follow Up: 2 weeks for reevaluation.  Could consider repeat trigger finger injection if not at least 50% better   Subjective:   I, Raymond Gamble am serving as a sEducation administratorfor Raymond Gamble Chief Complaint: left thumb pain   HPI:   09/19/22 Patient is a 46year old male complaining of left thumb pain . Patient states that left thumb is sticking and clicking , been goin gon for about 6-8 weeks, no MOI, no numbness or tingling, no radiating pain , no meds for the pain , would also like a referral to ENT hx of shattered nose 20 years ago and would like to be seen    Relevant Historical Information: Gout  Additional pertinent review of systems negative.   Current Outpatient Medications:    buPROPion (WELLBUTRIN XL) 150 MG 24 hr tablet, TAKE 1 TABLET BY MOUTH EVERY DAY, Disp: 30 tablet, Rfl: 1   escitalopram (LEXAPRO) 20 MG tablet, TAKE 1 TABLET BY MOUTH DAILY, Disp: 90 tablet, Rfl: 3   Multiple Vitamin (MULTIVITAMIN) tablet, Take 1 tablet by mouth daily., Disp: , Rfl:    rosuvastatin (CRESTOR) 20 MG tablet, TAKE 1 TABLET BY MOUTH DAILY, Disp: 90 tablet, Rfl: 3   Objective:     Vitals:   09/19/22 0825  BP: 118/82  Pulse: 68  SpO2: 99%  Weight: 279 lb (126.6 kg)  Height: '6\' 4"'$  (1.93 m)      Body mass index is 33.96 kg/m.    Physical Exam:    General: Appears well, nad, nontoxic and pleasant Neuro:sensation intact, strength is 5/5 with df/pf/inv/ev, muscle tone wnl Skin:no susupicious lesions or rashes  Left hand/wrist:  No deformity or swelling appreciated. Palpable nodule at the palmar surface of first MTP that is TTP and mobile.  Triggering felt at the site with first digit flexion and extension Wrist ROM  Ext 90, flexion70, radial/ulnar deviation 30 nttp over the snauff box, dorsal carpals, volar carpals, radial styloid, ulnar styloid,   tfcc   Electronically signed by:  Raymond Gamble D.[facility] 8:48 AM 09/19/22

## 2022-09-19 NOTE — Patient Instructions (Addendum)
Good to see you  ENT referral  Dr. Radene Journey [phone] 2 week follow up

## 2022-09-26 ENCOUNTER — Encounter: Payer: BC Managed Care – PPO | Admitting: Family Medicine

## 2022-09-26 ENCOUNTER — Encounter: Payer: Self-pay | Admitting: Family Medicine

## 2022-10-02 NOTE — Progress Notes (Unsigned)
    Raymond Gamble D.[place] Phone: [phone]   Assessment and Plan:     There are no diagnoses linked to this encounter.  ***   Pertinent previous records reviewed include ***   Follow Up: ***     Subjective:   I, Raymond Gamble, am serving as a Education administrator for Doctor Raymond Gamble   Chief Complaint: left thumb pain    HPI:    09/19/22 Patient is a 46 year old male complaining of left thumb pain . Patient states that left thumb is sticking and clicking , been goin gon for about 6-8 weeks, no MOI, no numbness or tingling, no radiating pain , no meds for the pain , would also like a referral to ENT hx of shattered nose 20 years ago and would like to be seen    10/03/2022 Patient states  Relevant Historical Information: Gout Additional pertinent review of systems negative.   Current Outpatient Medications:    buPROPion (WELLBUTRIN XL) 150 MG 24 hr tablet, TAKE 1 TABLET BY MOUTH EVERY DAY, Disp: 30 tablet, Rfl: 1   escitalopram (LEXAPRO) 20 MG tablet, TAKE 1 TABLET BY MOUTH DAILY, Disp: 90 tablet, Rfl: 3   Multiple Vitamin (MULTIVITAMIN) tablet, Take 1 tablet by mouth daily., Disp: , Rfl:    rosuvastatin (CRESTOR) 20 MG tablet, TAKE 1 TABLET BY MOUTH DAILY, Disp: 90 tablet, Rfl: 3   Objective:     There were no vitals filed for this visit.    There is no height or weight on file to calculate BMI.    Physical Exam:    ***   Electronically signed by:  Raymond Gamble D.[facility] 7:46 AM 10/02/22

## 2022-10-03 ENCOUNTER — Ambulatory Visit: Payer: BC Managed Care – PPO | Admitting: Sports Medicine

## 2022-10-03 VITALS — BP 134/82 | HR 74 | Ht 76.0 in | Wt 282.0 lb

## 2022-10-03 DIAGNOSIS — M65312 Trigger thumb, left thumb: Secondary | ICD-10-CM

## 2022-10-03 DIAGNOSIS — Z8781 Personal history of (healed) traumatic fracture: Secondary | ICD-10-CM

## 2022-10-03 NOTE — Patient Instructions (Addendum)
Good to see you  New ENT referral As needed follow up

## 2022-10-11 ENCOUNTER — Other Ambulatory Visit: Payer: Self-pay | Admitting: Family Medicine

## 2022-10-16 ENCOUNTER — Ambulatory Visit (INDEPENDENT_AMBULATORY_CARE_PROVIDER_SITE_OTHER): Payer: BC Managed Care – PPO | Admitting: Family Medicine

## 2022-10-16 ENCOUNTER — Encounter: Payer: Self-pay | Admitting: Family Medicine

## 2022-10-16 VITALS — BP 120/84 | HR 63 | Temp 98.3°F | Ht 76.0 in | Wt 278.4 lb

## 2022-10-16 DIAGNOSIS — Z23 Encounter for immunization: Secondary | ICD-10-CM | POA: Diagnosis not present

## 2022-10-16 DIAGNOSIS — Z0000 Encounter for general adult medical examination without abnormal findings: Secondary | ICD-10-CM | POA: Diagnosis not present

## 2022-10-16 MED ORDER — TREMFYA 100 MG/ML ~~LOC~~ SOAJ: SUBCUTANEOUS | 0 refills | Status: DC

## 2022-10-16 NOTE — Progress Notes (Signed)
Chief Complaint  Patient presents with   Annual Exam    Well Male Raymond Gamble is here for a complete physical.   His last physical was >1 year ago.  Current diet: in general, a "healthy" diet.   Current exercise: cardio, [employer], wt resistance exercise Weight trend: stable Fatigue out of ordinary? No. Seat belt? Yes.   Advanced directive? No  Health maintenance Tetanus- Yes HIV- Yes Hep C- Yes  Past Medical History:  Diagnosis Date   GAD (generalized anxiety disorder)    Gout    Hyperlipidemia      Past Surgical History:  Procedure Laterality Date   NASAL RECONSTRUCTION  2001    Medications  Current Outpatient Medications on File Prior to Visit  Medication Sig Dispense Refill   buPROPion (WELLBUTRIN XL) 150 MG 24 hr tablet TAKE 1 TABLET BY MOUTH EVERY DAY 30 tablet 1   escitalopram (LEXAPRO) 20 MG tablet TAKE 1 TABLET BY MOUTH DAILY 90 tablet 3   rosuvastatin (CRESTOR) 20 MG tablet TAKE 1 TABLET BY MOUTH DAILY 90 tablet 3   Allergies No Known Allergies  Family History Family History  Problem Relation Age of Onset   Cancer Mother    Hypertension Father    Diabetes Father    Obesity Father    Hyperlipidemia Father    Colon cancer Other    Depression Neg Hx    Esophageal cancer Neg Hx    Stomach cancer Neg Hx    Rectal cancer Neg Hx    Colon polyps Neg Hx     Review of Systems: Constitutional: no fevers or chills Eye:  no recent significant change in vision Ear/Nose/Mouth/Throat:  Ears:  no hearing loss Nose/Mouth/Throat:  no complaints of nasal congestion, no sore throat Cardiovascular:  no chest pain Respiratory:  no shortness of breath Gastrointestinal:  no abdominal pain, no change in bowel habits GU:  Male: negative for dysuria, frequency, and incontinence Musculoskeletal/Extremities:  no pain of the joints Integumentary (Skin/Breast):  no abnormal skin lesions reported Neurologic:  no headaches Endocrine: No unexpected weight  changes Hematologic/Lymphatic:  no night sweats  Exam BP 120/84 (BP Location: Right Arm, Patient Position: Sitting, Cuff Size: Normal)   Pulse 63   Temp 98.3 F (36.8 C) (Oral)   Ht '6\' 4"'$  (1.93 m)   Wt 278 lb 6 oz (126.3 kg)   SpO2 97%   BMI 33.88 kg/m  General:  well developed, well nourished, in no apparent distress Skin:  no significant moles, warts, or growths Head:  no masses, lesions, or tenderness Eyes:  pupils equal and round, sclera anicteric without injection Ears:  canals without lesions, TMs shiny without retraction, no obvious effusion, no erythema Nose:  nares patent, mucosa normal Throat/Pharynx:  lips and gingiva without lesion; tongue and uvula midline; non-inflamed pharynx; no exudates or postnasal drainage Neck: neck supple without adenopathy, thyromegaly, or masses Lungs:  clear to auscultation, breath sounds equal bilaterally, no respiratory distress Cardio:  regular rate and rhythm, no bruits, no LE edema Abdomen:  abdomen soft, nontender; bowel sounds normal; no masses or organomegaly Rectal: Deferred Musculoskeletal:  symmetrical muscle groups noted without atrophy or deformity Extremities:  no clubbing, cyanosis, or edema, no deformities, no skin discoloration Neuro:  gait normal; deep tendon reflexes normal and symmetric Psych: well oriented with normal range of affect and appropriate judgment/insight  Assessment and Plan  Well adult exam - Plan: CBC, Comprehensive metabolic panel, Lipid panel   Well 46 y.o. male. Counseled on diet  and exercise. Counseled on risks and benefits of prostate cancer screening with PSA. The patient agrees to forego screening.  Advanced directive form provided today.  Flu shot today.  Other orders as above. Follow up in 6 mo pending the above workup. The patient voiced understanding and agreement to the plan.  [place], DO 10/16/22 2:07 PM

## 2022-10-16 NOTE — Addendum Note (Signed)
Addended by: Sharon Seller B on: 10/16/2022 02:17 PM   Modules accepted: Orders

## 2022-10-16 NOTE — Patient Instructions (Addendum)
Give [place] 2-3 business days to get the results of your labs back.   Keep the diet clean and stay active.  Please get me a copy of your advanced directive form at your convenience.   [facility] ENT: [phone]   Let us know if you need anything.

## 2022-10-17 ENCOUNTER — Other Ambulatory Visit: Payer: Self-pay | Admitting: Family Medicine

## 2022-10-17 DIAGNOSIS — R748 Abnormal levels of other serum enzymes: Secondary | ICD-10-CM

## 2022-10-17 LAB — COMPREHENSIVE METABOLIC PANEL
ALT: 57 U/L — ABNORMAL HIGH (ref 0–53)
AST: 31 U/L (ref 0–37)
Albumin: 4.8 g/dL (ref 3.5–5.2)
Alkaline Phosphatase: 63 U/L (ref 39–117)
BUN: 15 mg/dL (ref 6–23)
CO2: 30 mEq/L (ref 19–32)
Calcium: 9.6 mg/dL (ref 8.4–10.5)
Chloride: 102 mEq/L (ref 96–112)
Creatinine, Ser: 1.01 mg/dL (ref 0.40–1.50)
GFR: 89.07 mL/min (ref >60.00)
Glucose, Bld: 71 mg/dL (ref 70–99)
Potassium: 4.4 mEq/L (ref 3.5–5.1)
Sodium: 138 mEq/L (ref 135–145)
Total Bilirubin: 0.6 mg/dL (ref 0.2–1.2)
Total Protein: 7.3 g/dL (ref 6.0–8.3)

## 2022-10-17 LAB — LDL CHOLESTEROL, DIRECT: Direct LDL: 92 mg/dL

## 2022-10-17 LAB — CBC
HCT: 46.1 % (ref 39.0–52.0)
Hemoglobin: 16 g/dL (ref 13.0–17.0)
MCHC: 34.8 g/dL (ref 30.0–36.0)
MCV: 88.2 fl (ref 78.0–100.0)
Platelets: 225 10*3/uL (ref 150.0–400.0)
RBC: 5.22 Mil/uL (ref 4.22–5.81)
RDW: 14.9 % (ref 11.5–15.5)
WBC: 7.1 10*3/uL (ref 4.0–10.5)

## 2022-10-17 LAB — LIPID PANEL
Cholesterol: 166 mg/dL (ref 0–200)
HDL: 32.6 mg/dL — ABNORMAL LOW (ref >39.00)
Total CHOL/HDL Ratio: 5
Triglycerides: 412 mg/dL — ABNORMAL HIGH (ref 0.0–149.0)

## 2022-10-26 ENCOUNTER — Encounter: Payer: Self-pay | Admitting: Family Medicine

## 2022-11-20 ENCOUNTER — Telehealth (HOSPITAL_BASED_OUTPATIENT_CLINIC_OR_DEPARTMENT_OTHER): Payer: Self-pay

## 2022-12-05 ENCOUNTER — Encounter: Payer: Self-pay | Admitting: Family Medicine

## 2022-12-06 ENCOUNTER — Ambulatory Visit: Payer: BC Managed Care – PPO | Admitting: Sports Medicine

## 2022-12-06 VITALS — BP 130/84 | HR 67 | Ht 76.0 in | Wt 284.0 lb

## 2022-12-06 DIAGNOSIS — M9908 Segmental and somatic dysfunction of rib cage: Secondary | ICD-10-CM

## 2022-12-06 DIAGNOSIS — M9907 Segmental and somatic dysfunction of upper extremity: Secondary | ICD-10-CM | POA: Diagnosis not present

## 2022-12-06 DIAGNOSIS — S29012A Strain of muscle and tendon of back wall of thorax, initial encounter: Secondary | ICD-10-CM

## 2022-12-06 DIAGNOSIS — M9902 Segmental and somatic dysfunction of thoracic region: Secondary | ICD-10-CM

## 2022-12-06 MED ORDER — CYCLOBENZAPRINE HCL 10 MG PO TABS: 10.0000 mg | ORAL_TABLET | Freq: Every evening | ORAL | 0 refills | Status: DC | PRN

## 2022-12-06 MED ORDER — MELOXICAM 15 MG PO TABS: 15.0000 mg | ORAL_TABLET | Freq: Every day | ORAL | 0 refills | Status: DC

## 2022-12-06 NOTE — Patient Instructions (Addendum)
Good to see you  - Start meloxicam 15 mg daily x2 weeks.  If still having pain after 2 weeks, complete 3rd-week of meloxicam. May use remaining meloxicam as needed once daily for pain control.  Do not to use additional NSAIDs while taking meloxicam.  May use Tylenol [phone] mg 2 to 3 times a day for breakthrough pain. Flexeril 10 mg nightly Shoulder HEP  2 week follow up

## 2022-12-06 NOTE — Progress Notes (Signed)
Raymond Gamble D.[place] Phone: [phone]   Assessment and Plan:     1. Strain of right rhomboid muscle 2. Somatic dysfunction of thoracic region 3. Somatic dysfunction of rib region 4. Somatic dysfunction of upper extremities -Acute, initial sports medicine visit - Most consistent with strain and trigger point within the right rhomboid based on HPI and physical exam - No red flag symptoms on physical exam, so no imaging at today's visit - Start meloxicam 15 mg daily x2 weeks.  If still having pain after 2 weeks, complete 3rd-week of meloxicam. May use remaining meloxicam as needed once daily for pain control.  Do not to use additional NSAIDs while taking meloxicam.  May use Tylenol [phone] mg 2 to 3 times a day for breakthrough pain. - Start Flexeril 10 mg nightly as needed for muscle spasms - Start HEP for shoulder blade - Patient elected for initial OMT today.  Tolerated well per note below. - Decision today to treat with OMT was based on Physical Exam  After verbal consent patient was treated with HVLA (high velocity low amplitude), ME (muscle energy), FPR (flex positional release), ST (soft tissue),  techniques in thoracic, rib, upper extremity areas. Patient tolerated the procedure well with improvement in symptoms.  Patient educated on potential side effects of soreness and recommended to rest, hydrate, and use Tylenol as needed for pain control.    Other orders - meloxicam (MOBIC) 15 MG tablet; Take 1 tablet (15 mg total) by mouth daily. - cyclobenzaprine (FLEXERIL) 10 MG tablet; Take 1 tablet (10 mg total) by mouth at bedtime as needed for muscle spasms.    Pertinent previous records reviewed include none   Follow Up: 2 weeks for reevaluation or sooner if no improvement.  Could consider trigger point injections versus repeat OMT.  Could obtain thoracic imaging if no improvement or worsening of  symptoms   Subjective:   I, Raymond Gamble, am serving as a Education administrator for Doctor Glennon Mac  Chief Complaint: neck pain   HPI:   12/06/22 Patient is a 47 year old male complaining of neck pain. Patient states slept wrong and have had a horrible kink in my neck shoulder for 6 days now and it is not getting any better.   I got a deep tissue massage which felt great at the time but no better.   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  I asked about a muscle relaxer and he said that wouldn't really do anything for it.  He said to keep working it out with a tennis ball and/or golf ball with pressure.   I'm in pain and can't sleep more than 1.5-2 hrs without getting up and pacing around; icing, massaging, hot pack, he is interested in trigger point injections   Relevant Historical Information: None pertinent  Additional pertinent review of systems negative.   Current Outpatient Medications:    buPROPion (WELLBUTRIN XL) 150 MG 24 hr tablet, TAKE 1 TABLET BY MOUTH EVERY DAY, Disp: 30 tablet, Rfl: 1   cyclobenzaprine (FLEXERIL) 10 MG tablet, Take 1 tablet (10 mg total) by mouth at bedtime as needed for muscle spasms., Disp: 30 tablet, Rfl: 0   escitalopram (LEXAPRO) 20 MG tablet, TAKE 1 TABLET BY MOUTH DAILY, Disp: 90 tablet, Rfl: 3   Guselkumab (TREMFYA) 100 MG/ML SOPN,  Injected as instructed by derm., Disp: 0.28 mL, Rfl: 0   meloxicam (MOBIC) 15 MG tablet, Take 1 tablet (15 mg total) by mouth daily., Disp: 30 tablet, Rfl: 0   rosuvastatin (CRESTOR) 20 MG tablet, TAKE 1 TABLET BY MOUTH DAILY, Disp: 90 tablet, Rfl: 3   Objective:     Vitals:   12/06/22 1259  BP: 130/84  Pulse: 67  SpO2: 97%  Weight: 284 lb (128.8 kg)  Height: '6\' 4"'$  (1.93 m)      Body mass index is 34.57 kg/m.    Physical Exam:    General: Well-appearing, cooperative, sitting comfortably in no acute distress.   OMT  Physical Exam:   Rib: Ribs 4-7 stuck in inhalation Thoracic: TTP paraspinal, T6 RRSR Upper extremity: TTP with ropey texture of right rhomboid and pain with right scapular retraction  Electronically signed by:  Raymond Gamble D.[facility] 1:51 PM 12/06/22

## 2022-12-12 ENCOUNTER — Encounter: Payer: Self-pay | Admitting: Family Medicine

## 2022-12-12 ENCOUNTER — Ambulatory Visit (INDEPENDENT_AMBULATORY_CARE_PROVIDER_SITE_OTHER): Payer: BC Managed Care – PPO | Admitting: Family Medicine

## 2022-12-12 VITALS — BP 130/82 | HR 75 | Temp 98.5°F | Ht 76.0 in | Wt 283.5 lb

## 2022-12-12 DIAGNOSIS — M791 Myalgia, unspecified site: Secondary | ICD-10-CM | POA: Diagnosis not present

## 2022-12-12 DIAGNOSIS — M549 Dorsalgia, unspecified: Secondary | ICD-10-CM | POA: Diagnosis not present

## 2022-12-12 MED ORDER — METHYLPREDNISOLONE ACETATE 40 MG/ML IJ SUSP
0.5000 mg | Freq: Once | INTRAMUSCULAR | Status: AC
  Administered 2022-12-12: 0.4 mg via INTRA_ARTICULAR

## 2022-12-12 NOTE — Progress Notes (Signed)
Musculoskeletal Exam  Patient: Raymond Gamble DOB: 13-Oct-1976  DOS: 12/12/2022  SUBJECTIVE:  Chief Complaint:   Chief Complaint  Patient presents with   Shoulder Pain    Right Painful for 3 weeks     Raymond Gamble is a 47 y.o.  male for evaluation and treatment of upper back pain.   Onset:  3 weeks ago. No inj or change in activity.  Location: R upper back Character:  aching  Progression of issue:  has worsened Associated symptoms: no bruising, redness, swelling, decreased ROM Treatment: to date has been ice, prescription NSAIDS, acetaminophen, muscle relaxers, and home exercises.   Neurovascular symptoms: no  Past Medical History:  Diagnosis Date   GAD (generalized anxiety disorder)    Gout    Hyperlipidemia     Objective: VITAL SIGNS: BP 130/82 (BP Location: Left Arm, Cuff Size: Large)   Pulse 75   Temp 98.5 F (36.9 C) (Oral)   Ht '6\' 4"'$  (1.93 m)   Wt 283 lb 8 oz (128.6 kg)   SpO2 97%   BMI 34.51 kg/m  Constitutional: Well formed, well developed. No acute distress. Thorax & Lungs: No accessory muscle use Musculoskeletal: R upper back.   Normal active range of motion: yes.   Normal passive range of motion: yes Tenderness to palpation: Yes over inferior and superior portion of rhomboid, right trapezius, and posterior deltoid Deformity: no Ecchymosis: no Tests positive: None Tests negative: Spurling's, Neer's, crossover, liftoff, speeds, O'Briens Neurologic: Normal sensory function. No focal deficits noted. DTR's equal and symmetric in the UE's. No clonus. Psychiatric: Normal mood. Age appropriate judgment and insight. Alert & oriented x 3.    Procedure note; trigger point injection (x4) Verbal consent obtained. The areas of interest were demarcated with an otoscope speculum tip. There were then cleaned with alcohol. 0.75 mL were injected at each trigger point with a concoction of 20 mg Depo-Medrol (0.5 mL) and 2.5 mL 2% lidocaine. The areas were then  bandaged. There were no complications noted. The patient tolerated the procedure well.  Assessment:  Trigger point - Plan: Inject trigger point, 1 or 2, Inject trigger points, > 3, methylPREDNISolone acetate (DEPO-MEDROL) injection 0.4 mg  Mid back pain - Plan: methylPREDNISolone acetate (DEPO-MEDROL) injection 0.4 mg  Plan: Trigger point injections today.  Stretches and exercises for the mid back region provided.  If no improvement, will refer to physical therapy.   F/u as originally scheduled. The patient voiced understanding and agreement to the plan.   [place], DO 12/12/22  12:36 PM

## 2022-12-12 NOTE — Patient Instructions (Addendum)
Heat (pad or rice pillow in microwave) over affected area, 10-15 minutes twice daily.   Ice/cold pack over area for 10-15 min twice daily.  OK to take Tylenol 1000 mg (2 extra strength tabs) or 975 mg (3 regular strength tabs) every 6 hours as needed.  Send me a message if you are interested in physical therapy.  Let us know if you need anything.  Mid-Back Strain Rehab It is normal to feel mild stretching, pulling, tightness, or discomfort as you do these exercises, but you should stop right away if you feel sudden pain or your pain gets worse.  Stretching and range of motion exercises This exercise warms up your muscles and joints and improves the movement and flexibility of your back and shoulders. This exercise also help to relieve pain. Exercise A: Chest and spine stretch  Lie down on your back on a firm surface. Roll a towel or a small blanket so it is about 4 inches (10 cm) in diameter. Put the towel lengthwise under the middle of your back so it is under your spine, but not under your shoulder blades. To increase the stretch, you may put your hands behind your head and let your elbows fall to your sides. Hold for 30 seconds. Repeat exercise 2 times. Complete this exercise 3 times per week. Strengthening exercises These exercises build strength and endurance in your back and your shoulder blade muscles. Endurance is the ability to use your muscles for a long time, even after they get tired. Exercise C: Straight arm rows (shoulder extension) Stand with your feet shoulder width apart. Secure an exercise band to a stable object in front of you so the band is at or above shoulder height. Hold one end of the exercise band in each hand. Straighten your elbows and lift your hands up to shoulder height. Step back, away from the secured end of the exercise band, until the band stretches. Squeeze your shoulder blades together and pull your hands down to the sides of your thighs. Stop when  your hands are straight down by your sides. Do not let your hands go behind your body. Hold for 2 seconds. Slowly return to the starting position. Repeat 2 times. Complete this exercise 3 times per week. Exercise D: Shoulder external rotation, prone Lie on your abdomen on a firm bed so your left / right forearm hangs over the edge of the bed and your upper arm is on the bed, straight out from your body. Your elbow should be bent. Your palm should be facing your feet. If instructed, hold a 2-5 lb weight in your hand. Squeeze your shoulder blade toward the middle of your back. Do not let your shoulder lift toward your ear. Keep your elbow bent in an "L" shape (90 degrees) while you slowly move your forearm up toward the ceiling. Move your forearm up to the height of the bed, toward your head. Your upper arm should not move. At the top of the movement, your palm should face the floor. Hold for 1 second. Slowly return to the starting position and relax your muscles. Repeat 2 times. Complete this exercise 3 times per week. Exercise E: Scapular retraction and external rotation, rowing  Sit in a stable chair without armrests, or stand. Secure an exercise band to a stable object in front of you so it is at shoulder height. Hold one end of the exercise band in each hand. Bring your arms out straight in front of you. Step back,  away from the secured end of the exercise band, until the band stretches. Pull the band backward. As you do this, bend your elbows and squeeze your shoulder blades together, but avoid letting the rest of your body move. Do not let your shoulders lift up toward your ears. Stop when your elbows are at your sides or slightly behind your body. Hold for 1 second1. Slowly straighten your arms to return to the starting position. Repeat 2 times. Complete this exercise 3 times per week. Posture and body mechanics  Body mechanics refers to the movements and positions of your body  while you do your daily activities. Posture is part of body mechanics. Good posture and healthy body mechanics can help to relieve stress in your body's tissues and joints. Good posture means that your spine is in its natural S-curve position (your spine is neutral), your shoulders are pulled back slightly, and your head is not tipped forward. The following are general guidelines for applying improved posture and body mechanics to your everyday activities. Standing  When standing, keep your spine neutral and your feet about hip-width apart. Keep a slight bend in your knees. Your ears, shoulders, and hips should line up. When you do a task in which you lean forward while standing in one place for a long time, place one foot up on a stable object that is 2-4 inches (5-10 cm) high, such as a footstool. This helps keep your spine neutral. Sitting  When sitting, keep your spine neutral and keep your feet flat on the floor. Use a footrest, if necessary, and keep your thighs parallel to the floor. Avoid rounding your shoulders, and avoid tilting your head forward. When working at a desk or a computer, keep your desk at a height where your hands are slightly lower than your elbows. Slide your chair under your desk so you are close enough to maintain good posture. When working at a computer, place your monitor at a height where you are looking straight ahead and you do not have to tilt your head forward or downward to look at the screen. Resting  When lying down and resting, avoid positions that are most painful for you. If you have pain with activities such as sitting, bending, stooping, or squatting (flexion-based activities), lie in a position in which your body does not bend very much. For example, avoid curling up on your side with your arms and knees near your chest (fetal position). If you have pain with activities such as standing for a long time or reaching with your arms (extension-based activities),  lie with your spine in a neutral position and bend your knees slightly. Try the following positions: Lying on your side with a pillow between your knees. Lying on your back with a pillow under your knees.  Lifting  When lifting objects, keep your feet at least shoulder-width apart and tighten your abdominal muscles. Bend your knees and hips and keep your spine neutral. It is important to lift using the strength of your legs, not your back. Do not lock your knees straight out. Always ask for help to lift heavy or awkward objects. Make sure you discuss any questions you have with your health care provider. Document Released: 11/12/2005 Document Revised: 07/19/2016 Document Reviewed: 08/24/2015 Elsevier Interactive Patient Education  [employer].

## 2022-12-18 ENCOUNTER — Other Ambulatory Visit: Payer: Self-pay | Admitting: Family Medicine

## 2022-12-19 NOTE — Progress Notes (Unsigned)
Raymond Gamble D.[place] Phone: [phone]   Assessment and Plan:     There are no diagnoses linked to this encounter.  ***   Pertinent previous records reviewed include ***   Follow Up: ***     Subjective:   I, Raymond Gamble, am serving as a Education administrator for Raymond Gamble   Chief Complaint: neck pain    HPI:    12/06/22 Patient is a 47 year old male complaining of neck pain. Patient states slept wrong and have had a horrible kink in my neck shoulder for 6 days now and it is not getting any better.   I got a deep tissue massage which felt great at the time but no better.   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  I asked about a muscle relaxer and he said that wouldn't really do anything for it.  He said to keep working it out with a tennis ball and/or golf ball with pressure.   I'm in pain and can't sleep more than 1.5-2 hrs without getting up and pacing around; icing, massaging, hot pack, he is interested in trigger point injections    12/20/2022 Patient states   Relevant Historical Information: None pertinent  Additional pertinent review of systems negative.   Current Outpatient Medications:    buPROPion (WELLBUTRIN XL) 150 MG 24 hr tablet, TAKE 1 TABLET BY MOUTH EVERY DAY, Disp: 30 tablet, Rfl: 1   cyclobenzaprine (FLEXERIL) 10 MG tablet, Take 1 tablet (10 mg total) by mouth at bedtime as needed for muscle spasms., Disp: 30 tablet, Rfl: 0   escitalopram (LEXAPRO) 20 MG tablet, TAKE 1 TABLET BY MOUTH DAILY, Disp: 90 tablet, Rfl: 3   Guselkumab (TREMFYA) 100 MG/ML SOPN, Injected as instructed by derm., Disp: 0.28 mL, Rfl: 0   meloxicam (MOBIC) 15 MG tablet, Take 1 tablet (15 mg total) by mouth daily., Disp: 30 tablet, Rfl: 0   rosuvastatin (CRESTOR) 20 MG tablet, TAKE 1 TABLET BY MOUTH DAILY, Disp: 90  tablet, Rfl: 3   Objective:     There were no vitals filed for this visit.    There is no height or weight on file to calculate BMI.    Physical Exam:    ***   Electronically signed by:  Raymond Gamble D.[facility] 11:54 AM 12/19/22

## 2022-12-20 ENCOUNTER — Ambulatory Visit (INDEPENDENT_AMBULATORY_CARE_PROVIDER_SITE_OTHER): Payer: BC Managed Care – PPO

## 2022-12-20 ENCOUNTER — Ambulatory Visit: Payer: BC Managed Care – PPO | Admitting: Sports Medicine

## 2022-12-20 VITALS — BP 120/80 | HR 84 | Ht 76.0 in | Wt 283.0 lb

## 2022-12-20 DIAGNOSIS — S29012A Strain of muscle and tendon of back wall of thorax, initial encounter: Secondary | ICD-10-CM

## 2022-12-20 DIAGNOSIS — M542 Cervicalgia: Secondary | ICD-10-CM | POA: Diagnosis not present

## 2022-12-20 DIAGNOSIS — M546 Pain in thoracic spine: Secondary | ICD-10-CM

## 2022-12-20 MED ORDER — GABAPENTIN 100 MG PO CAPS: 100.0000 mg | ORAL_CAPSULE | Freq: Two times a day (BID) | ORAL | 1 refills | Status: DC

## 2022-12-20 MED ORDER — METHYLPREDNISOLONE 4 MG PO TBPK: ORAL_TABLET | ORAL | 0 refills | Status: DC

## 2022-12-20 NOTE — Patient Instructions (Addendum)
Good to see you prednisone dos pak Start gabapentin 100-200 mg twice a day  Recommend starting gabapentin in 5 days when finishing prednisone dos pak  Can use melatonin 5 mg nightly for sleep  Can use flexeril 10 mg or benadryl for additional sleep aid  3 week follow up

## 2023-01-09 NOTE — Progress Notes (Unsigned)
    Raymond Gamble D.[place] Phone: [phone]   Assessment and Plan:     There are no diagnoses linked to this encounter.  ***   Pertinent previous records reviewed include ***   Follow Up: ***     Subjective:   I, Raymond Gamble, am serving as a Education administrator for Raymond Gamble   Chief Complaint: neck pain    HPI:    12/06/22 Patient is a 47 year old male complaining of neck pain. Patient states slept wrong and have had a horrible kink in my neck shoulder for 6 days now and it is not getting any better.   I got a deep tissue massage which felt great at the time but no better.   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  I asked about a muscle relaxer and he said that wouldn't really do anything for it.  He said to keep working it out with a tennis ball and/or golf ball with pressure.   I'm in pain and can't sleep more than 1.5-2 hrs without getting up and pacing around; icing, massaging, hot pack, he is interested in trigger point injections    12/20/2022 Patient states he is kind of good kind of bad, PCP gave 4 shots , now the pain is in different places, none of the meds are helping, once meds wear off the pain is back tenfold , has found new knots     01/10/2023 Patient states    Relevant Historical Information: None pertinent  Additional pertinent review of systems negative.   Current Outpatient Medications:    buPROPion (WELLBUTRIN XL) 150 MG 24 hr tablet, TAKE 1 TABLET BY MOUTH EVERY DAY, Disp: 30 tablet, Rfl: 1   cyclobenzaprine (FLEXERIL) 10 MG tablet, Take 1 tablet (10 mg total) by mouth at bedtime as needed for muscle spasms., Disp: 30 tablet, Rfl: 0   escitalopram (LEXAPRO) 20 MG tablet, TAKE 1 TABLET BY MOUTH DAILY, Disp: 90 tablet, Rfl: 3   gabapentin (NEURONTIN) 100 MG capsule, Take 1 capsule (100  mg total) by mouth 2 (two) times daily., Disp: 60 capsule, Rfl: 1   Guselkumab (TREMFYA) 100 MG/ML SOPN, Injected as instructed by derm., Disp: 0.28 mL, Rfl: 0   meloxicam (MOBIC) 15 MG tablet, Take 1 tablet (15 mg total) by mouth daily., Disp: 30 tablet, Rfl: 0   methylPREDNISolone (MEDROL DOSEPAK) 4 MG TBPK tablet, Take 6 tablets on day 1.  Take 5 tablets on day 2.  Take 4 tablets on day 3.  Take 3 tablets on day 4.  Take 2 tablets on day 5.  Take 1 tablet on day 6., Disp: 21 tablet, Rfl: 0   rosuvastatin (CRESTOR) 20 MG tablet, TAKE 1 TABLET BY MOUTH DAILY, Disp: 90 tablet, Rfl: 3   Objective:     There were no vitals filed for this visit.    There is no height or weight on file to calculate BMI.    Physical Exam:    ***   Electronically signed by:  Raymond Gamble D.[facility] 7:34 AM 01/09/23

## 2023-01-10 ENCOUNTER — Ambulatory Visit (INDEPENDENT_AMBULATORY_CARE_PROVIDER_SITE_OTHER): Payer: BC Managed Care – PPO | Admitting: Sports Medicine

## 2023-01-10 VITALS — BP 118/80 | HR 87 | Ht 76.0 in | Wt 281.0 lb

## 2023-01-10 DIAGNOSIS — S29012A Strain of muscle and tendon of back wall of thorax, initial encounter: Secondary | ICD-10-CM

## 2023-01-10 DIAGNOSIS — M542 Cervicalgia: Secondary | ICD-10-CM | POA: Diagnosis not present

## 2023-01-10 DIAGNOSIS — M546 Pain in thoracic spine: Secondary | ICD-10-CM | POA: Diagnosis not present

## 2023-02-07 ENCOUNTER — Other Ambulatory Visit: Payer: Self-pay | Admitting: Family Medicine

## 2023-02-25 ENCOUNTER — Other Ambulatory Visit: Payer: Self-pay | Admitting: Family Medicine

## 2023-03-20 ENCOUNTER — Telehealth: Payer: BC Managed Care – PPO | Admitting: Nurse Practitioner

## 2023-03-20 DIAGNOSIS — J069 Acute upper respiratory infection, unspecified: Secondary | ICD-10-CM | POA: Diagnosis not present

## 2023-03-20 MED ORDER — IPRATROPIUM BROMIDE 0.03 % NA SOLN: 2.0000 | Freq: Two times a day (BID) | NASAL | 12 refills | Status: DC

## 2023-03-20 MED ORDER — BENZONATATE 100 MG PO CAPS: 100.0000 mg | ORAL_CAPSULE | Freq: Three times a day (TID) | ORAL | 0 refills | Status: DC | PRN

## 2023-03-20 NOTE — Progress Notes (Signed)
E-Visit for Upper Respiratory Infection   We are sorry you are not feeling well.  Here is how we plan to help!  Based on what you have shared with me, it looks like you may have a viral upper respiratory infection.  Upper respiratory infections are caused by a large number of viruses; however, rhinovirus is the most common cause. This may also be the flu, since symptoms are beyond 48 hours we would not recommend an anti-viral medication.   Symptoms vary from person to person, with common symptoms including sore throat, cough, fatigue or lack of energy and feeling of general discomfort.  A low-grade fever of up to 100.4 may present, but is often uncommon.  Symptoms vary however, and are closely related to a person's age or underlying illnesses.  The most common symptoms associated with an upper respiratory infection are nasal discharge or congestion, cough, sneezing, headache and pressure in the ears and face.  These symptoms usually persist for about 3 to 10 days, but can last up to 2 weeks.  It is important to know that upper respiratory infections do not cause serious illness or complications in most cases.    Upper respiratory infections can be transmitted from person to person, with the most common method of transmission being a person's hands.  The virus is able to live on the skin and can infect other persons for up to 2 hours after direct contact.  Also, these can be transmitted when someone coughs or sneezes; thus, it is important to cover the mouth to reduce this risk.  To keep the spread of the illness at bay, good hand hygiene is very important.  This is an infection that is most likely caused by a virus. There are no specific treatments other than to help you with the symptoms until the infection runs its course.  We are sorry you are not feeling well.  Here is how we plan to help!   For nasal congestion, you may use an oral decongestants such as Mucinex D or if you have glaucoma or high  blood pressure use plain Mucinex.  Saline nasal spray or nasal drops can help and can safely be used as often as needed for congestion.  For your congestion, I have prescribed Ipratropium Bromide nasal spray 0.03% two sprays in each nostril 2-3 times a day  If you do not have a history of heart disease, hypertension, diabetes or thyroid disease, prostate/bladder issues or glaucoma, you may also use Sudafed to treat nasal congestion.  It is highly recommended that you consult with a pharmacist or your primary care physician to ensure this medication is safe for you to take.     If you have a cough, you may use cough suppressants such as Delsym and Robitussin.  If you have glaucoma or high blood pressure, you can also use Coricidin HBP.   For cough I have prescribed for you A prescription cough medication called Tessalon Perles 100 mg. You may take 1-2 capsules every 8 hours as needed for cough  If you have a sore or scratchy throat, use a saltwater gargle-  to  teaspoon of salt dissolved in a 4-ounce to 8-ounce glass of warm water.  Gargle the solution for approximately 15-30 seconds and then spit.  It is important not to swallow the solution.  You can also use throat lozenges/cough drops and Chloraseptic spray to help with throat pain or discomfort.  Warm or cold liquids can also be helpful in relieving  throat pain.  For headache, pain or general discomfort, you can use Ibuprofen or Tylenol as directed.   Some authorities believe that zinc sprays or the use of Echinacea may shorten the course of your symptoms.   HOME CARE Only take medications as instructed by your medical team. Be sure to drink plenty of fluids. Water is fine as well as fruit juices, sodas and electrolyte beverages. You may want to stay away from caffeine or alcohol. If you are nauseated, try taking small sips of liquids. How do you know if you are getting enough fluid? Your urine should be a pale yellow or almost colorless. Get  rest. Taking a steamy shower or using a humidifier may help nasal congestion and ease sore throat pain. You can place a towel over your head and breathe in the steam from hot water coming from a faucet. Using a saline nasal spray works much the same way. Cough drops, hard candies and sore throat lozenges may ease your cough. Avoid close contacts especially the very young and the elderly Cover your mouth if you cough or sneeze Always remember to wash your hands.   GET HELP RIGHT AWAY IF: You develop worsening fever. If your symptoms do not improve within 10 days You develop yellow or green discharge from your nose over 3 days. You have coughing fits You develop a severe head ache or visual changes. You develop shortness of breath, difficulty breathing or start having chest pain Your symptoms persist after you have completed your treatment plan  MAKE SURE YOU  Understand these instructions. Will watch your condition. Will get help right away if you are not doing well or get worse.  Thank you for choosing an e-visit.  Your e-visit answers were reviewed by a board certified advanced clinical practitioner to complete your personal care plan. Depending upon the condition, your plan could have included both over the counter or prescription medications.  Please review your pharmacy choice. Make sure the pharmacy is open so you can pick up prescription now. If there is a problem, you may contact your provider through [employer] and have the prescription routed to another pharmacy.  Your safety is important to [place]. If you have drug allergies check your prescription carefully.   For the next 24 hours you can use MyChart to ask questions about today's visit, request a non-urgent call back, or ask for a work or school excuse. You will get an email in the next two days asking about your experience. I hope that your e-visit has been valuable and will speed your recovery.  Meds ordered this  encounter  Medications   ipratropium (ATROVENT) 0.03 % nasal spray    Sig: Place 2 sprays into both nostrils every 12 (twelve) hours.    Dispense:  30 mL    Refill:  12   benzonatate (TESSALON) 100 MG capsule    Sig: Take 1 capsule (100 mg total) by mouth 3 (three) times daily as needed.    Dispense:  30 capsule    Refill:  0    I spent approximately 5 minutes reviewing the patient's history, current symptoms and coordinating their care today.

## 2023-04-15 ENCOUNTER — Encounter: Payer: Self-pay | Admitting: Family Medicine

## 2023-05-08 ENCOUNTER — Other Ambulatory Visit: Payer: Self-pay | Admitting: Family Medicine

## 2023-05-29 ENCOUNTER — Telehealth: Payer: Self-pay

## 2023-05-29 ENCOUNTER — Ambulatory Visit (INDEPENDENT_AMBULATORY_CARE_PROVIDER_SITE_OTHER): Payer: BC Managed Care – PPO | Admitting: Family Medicine

## 2023-05-29 ENCOUNTER — Encounter: Payer: Self-pay | Admitting: Family Medicine

## 2023-05-29 ENCOUNTER — Other Ambulatory Visit: Payer: Self-pay | Admitting: Family Medicine

## 2023-05-29 VITALS — BP 118/78 | HR 65 | Temp 97.5°F | Ht 76.0 in | Wt 285.5 lb

## 2023-05-29 DIAGNOSIS — R252 Cramp and spasm: Secondary | ICD-10-CM

## 2023-05-29 DIAGNOSIS — E669 Obesity, unspecified: Secondary | ICD-10-CM

## 2023-05-29 DIAGNOSIS — R7303 Prediabetes: Secondary | ICD-10-CM

## 2023-05-29 LAB — CBC
HCT: 45.6 % (ref 39.0–52.0)
Hemoglobin: 15.9 g/dL (ref 13.0–17.0)
MCHC: 34.8 g/dL (ref 30.0–36.0)
MCV: 89.4 fl (ref 78.0–100.0)
Platelets: 204 10*3/uL (ref 150.0–400.0)
RBC: 5.1 Mil/uL (ref 4.22–5.81)
RDW: 14.4 % (ref 11.5–15.5)
WBC: 5.6 10*3/uL (ref 4.0–10.5)

## 2023-05-29 LAB — BASIC METABOLIC PANEL
BUN: 12 mg/dL (ref 6–23)
CO2: 26 mEq/L (ref 19–32)
Calcium: 9.4 mg/dL (ref 8.4–10.5)
Chloride: 105 mEq/L (ref 96–112)
Creatinine, Ser: 1.01 mg/dL (ref 0.40–1.50)
GFR: 88.68 mL/min (ref >60.00)
Glucose, Bld: 120 mg/dL — ABNORMAL HIGH (ref 70–99)
Potassium: 4.3 mEq/L (ref 3.5–5.1)
Sodium: 138 mEq/L (ref 135–145)

## 2023-05-29 LAB — IBC + FERRITIN
Ferritin: 387.5 ng/mL — ABNORMAL HIGH (ref 22.0–322.0)
Iron: 120 ug/dL (ref 42–165)
Saturation Ratios: 33.1 % (ref 20.0–50.0)
TIBC: 362.6 ug/dL (ref 250.0–450.0)
Transferrin: 259 mg/dL (ref 212.0–360.0)

## 2023-05-29 LAB — VITAMIN D 25 HYDROXY (VIT D DEFICIENCY, FRACTURES): VITD: 31.52 ng/mL (ref 30.00–100.00)

## 2023-05-29 LAB — HEMOGLOBIN A1C: Hgb A1c MFr Bld: 5.4 % (ref 4.6–6.5)

## 2023-05-29 LAB — TSH: TSH: 1.03 u[IU]/mL (ref 0.35–5.50)

## 2023-05-29 MED ORDER — SEMAGLUTIDE-WEIGHT MANAGEMENT 2.4 MG/0.75ML ~~LOC~~ SOAJ: 2.4000 mg | SUBCUTANEOUS | 0 refills | Status: DC

## 2023-05-29 MED ORDER — SEMAGLUTIDE-WEIGHT MANAGEMENT 0.5 MG/0.5ML ~~LOC~~ SOAJ: 0.5000 mg | SUBCUTANEOUS | 0 refills | Status: DC

## 2023-05-29 MED ORDER — SEMAGLUTIDE-WEIGHT MANAGEMENT 1 MG/0.5ML ~~LOC~~ SOAJ: 1.0000 mg | SUBCUTANEOUS | 0 refills | Status: DC

## 2023-05-29 MED ORDER — SEMAGLUTIDE-WEIGHT MANAGEMENT 0.25 MG/0.5ML ~~LOC~~ SOAJ: 0.2500 mg | SUBCUTANEOUS | 0 refills | Status: DC

## 2023-05-29 MED ORDER — SEMAGLUTIDE-WEIGHT MANAGEMENT 1.7 MG/0.75ML ~~LOC~~ SOAJ: 1.7000 mg | SUBCUTANEOUS | 0 refills | Status: DC

## 2023-05-29 NOTE — Addendum Note (Signed)
Addended by: Tashawn Laswell P on: 05/29/2023 05:04 PM   Modules accepted: Level of Service  

## 2023-05-29 NOTE — Telephone Encounter (Signed)
PA initiated via Covermymeds; KEY:  [id number]. Awaiting determination.

## 2023-05-29 NOTE — Telephone Encounter (Signed)
Patient informed of approval.  

## 2023-05-29 NOTE — Telephone Encounter (Signed)
PA approved. Effective 05/29/2023 - 12/30/2023

## 2023-05-29 NOTE — Progress Notes (Signed)
Chief Complaint  Patient presents with   Follow-up    Discuss diabetic medication    Subjective: Patient is a 47 y.o. male here for obesity.  Patient does not exercise routinely and diet could be better.  He is interested in medicine to help.  He does not have a known history of diabetes but does have prediabetes.  He has been urinating more frequently and drinking lots of water-associated leg cramps at night intermittently.  He wonders if he has transitioned to the diabetic state.  He has never undergone treatment through a supervised medical weight loss program.  No chest pain or shortness of breath.  Past Medical History:  Diagnosis Date   GAD (generalized anxiety disorder)    Gout    Hyperlipidemia     Objective: BP 118/78 (BP Location: Left Arm, Patient Position: Sitting, Cuff Size: Large)   Pulse 65   Temp (!) 97.5 F (36.4 C) (Oral)   Ht 6\' 4"  (1.93 m)   Wt 285 lb 8 oz (129.5 kg)   SpO2 98%   BMI 34.75 kg/m  General: Awake, appears stated age Heart: RRR, no LE edema Lungs: CTAB, no rales, wheezes or rhonchi. No accessory muscle use Psych: Age appropriate judgment and insight, normal affect and mood  Assessment and Plan: Prediabetes - Plan: Hemoglobin A1c  Obesity (BMI 30-39.9) - Plan: VITAMIN D 25 Hydroxy (Vit-D Deficiency, Fractures), TSH  Leg cramping - Plan: CBC, Basic metabolic panel, IBC + Ferritin  Check A1c Chronic, not controlled.  If labs are normal, will send in [facility] and follow-up in 6 weeks.  Counseled on diet and exercise. Check above.  Stay hydrated.  Consider pickle juice. The patient voiced understanding and agreement to the plan.  Jilda Roche [place], DO 05/29/23  10:32 AM

## 2023-05-29 NOTE — Addendum Note (Signed)
Addended by: Enzio Buchler P on: 05/29/2023 05:03 PM   Modules accepted: Level of Service  

## 2023-05-29 NOTE — Patient Instructions (Addendum)
Give [place] 2-3 business days to get the results of your labs back.   Depending on results, we will send in a weekly injectable.   Call ENT: [phone]   Let us know if you need anything.

## 2023-05-31 ENCOUNTER — Encounter: Payer: Self-pay | Admitting: Family Medicine

## 2023-06-04 ENCOUNTER — Other Ambulatory Visit: Payer: Self-pay | Admitting: Family Medicine

## 2023-06-04 ENCOUNTER — Other Ambulatory Visit (HOSPITAL_BASED_OUTPATIENT_CLINIC_OR_DEPARTMENT_OTHER): Payer: Self-pay

## 2023-06-04 ENCOUNTER — Encounter: Payer: Self-pay | Admitting: Family Medicine

## 2023-06-04 MED ORDER — SEMAGLUTIDE-WEIGHT MANAGEMENT 1.7 MG/0.75ML ~~LOC~~ SOAJ
1.7000 mg | SUBCUTANEOUS | 0 refills | Status: DC
  Filled 2023-06-04: qty 3, 28d supply, fill #0

## 2023-06-04 MED ORDER — SEMAGLUTIDE-WEIGHT MANAGEMENT 0.25 MG/0.5ML ~~LOC~~ SOAJ
0.2500 mg | SUBCUTANEOUS | 0 refills | Status: AC
  Filled 2023-06-04: qty 2, 28d supply, fill #0

## 2023-06-04 MED ORDER — SEMAGLUTIDE-WEIGHT MANAGEMENT 1 MG/0.5ML ~~LOC~~ SOAJ
1.0000 mg | SUBCUTANEOUS | 0 refills | Status: DC
  Filled 2023-06-04 – 2023-07-31 (×3): qty 2, 28d supply, fill #0

## 2023-06-04 MED ORDER — SEMAGLUTIDE-WEIGHT MANAGEMENT 0.5 MG/0.5ML ~~LOC~~ SOAJ
0.5000 mg | SUBCUTANEOUS | 0 refills | Status: AC
  Filled 2023-06-04 – 2023-06-28 (×2): qty 2, 28d supply, fill #0

## 2023-06-04 MED ORDER — SEMAGLUTIDE-WEIGHT MANAGEMENT 2.4 MG/0.75ML ~~LOC~~ SOAJ
2.4000 mg | SUBCUTANEOUS | 0 refills | Status: DC
  Filled 2023-06-04: qty 3, 28d supply, fill #0

## 2023-06-14 ENCOUNTER — Telehealth: Payer: BC Managed Care – PPO | Admitting: Nurse Practitioner

## 2023-06-14 DIAGNOSIS — J40 Bronchitis, not specified as acute or chronic: Secondary | ICD-10-CM | POA: Diagnosis not present

## 2023-06-14 MED ORDER — AZITHROMYCIN 250 MG PO TABS
ORAL_TABLET | ORAL | 0 refills | Status: AC
Start: 2023-06-14 — End: 2023-06-19

## 2023-06-14 MED ORDER — ALBUTEROL SULFATE HFA 108 (90 BASE) MCG/ACT IN AERS
2.0000 | INHALATION_SPRAY | Freq: Four times a day (QID) | RESPIRATORY_TRACT | 0 refills | Status: DC | PRN
Start: 2023-06-14 — End: 2023-11-18

## 2023-06-14 MED ORDER — BENZONATATE 100 MG PO CAPS
100.0000 mg | ORAL_CAPSULE | Freq: Three times a day (TID) | ORAL | 0 refills | Status: DC | PRN
Start: 2023-06-14 — End: 2023-08-21

## 2023-06-14 NOTE — Progress Notes (Signed)
E-Visit for Cough  We are sorry that you are not feeling well.  Here is how we plan to help!  Based on your presentation I believe you most likely have A cough due to bacteria.  When patients have a fever and a productive cough with a change in color or increased sputum production, we are concerned about bacterial bronchitis.  If left untreated it can progress to pneumonia.  If your symptoms do not improve with your treatment plan it is important that you contact your provider.   I have prescribed Azithromyin 250 mg: two tablets now and then one tablet daily for 4 additonal days    In addition you may use A prescription cough medication called Tessalon Perles 100mg . You may take 1-2 capsules every 8 hours as needed for your cough.   Weill also prescribe an inhaler to help with the wheezing    From your responses in the eVisit questionnaire you describe inflammation in the upper respiratory tract which is causing a significant cough.  This is commonly called Bronchitis and has four common causes:   Allergies Viral Infections Acid Reflux Bacterial Infection Allergies, viruses and acid reflux are treated by controlling symptoms or eliminating the cause. An example might be a cough caused by taking certain blood pressure medications. You stop the cough by changing the medication. Another example might be a cough caused by acid reflux. Controlling the reflux helps control the cough.  USE OF BRONCHODILATOR ("RESCUE") INHALERS: There is a risk from using your bronchodilator too frequently.  The risk is that over-reliance on a medication which only relaxes the muscles surrounding the breathing tubes can reduce the effectiveness of medications prescribed to reduce swelling and congestion of the tubes themselves.  Although you feel brief relief from the bronchodilator inhaler, your asthma may actually be worsening with the tubes becoming more swollen and filled with mucus.  This can delay other crucial  treatments, such as oral steroid medications. If you need to use a bronchodilator inhaler daily, several times per day, you should discuss this with your provider.  There are probably better treatments that could be used to keep your asthma under control.     HOME CARE Only take medications as instructed by your medical team. Complete the entire course of an antibiotic. Drink plenty of fluids and get plenty of rest. Avoid close contacts especially the very young and the elderly Cover your mouth if you cough or cough into your sleeve. Always remember to wash your hands A steam or ultrasonic humidifier can help congestion.   GET HELP RIGHT AWAY IF: You develop worsening fever. You become short of breath You cough up blood. Your symptoms persist after you have completed your treatment plan MAKE SURE YOU  Understand these instructions. Will watch your condition. Will get help right away if you are not doing well or get worse.    Thank you for choosing an e-visit.  Your e-visit answers were reviewed by a board certified advanced clinical practitioner to complete your personal care plan. Depending upon the condition, your plan could have included both over the counter or prescription medications.  Please review your pharmacy choice. Make sure the pharmacy is open so you can pick up prescription now. If there is a problem, you may contact your provider through [employer] and have the prescription routed to another pharmacy.  Your safety is important to [place]. If you have drug allergies check your prescription carefully.   For the next 24 hours  you can use MyChart to ask questions about today's visit, request a non-urgent call back, or ask for a work or school excuse. You will get an email in the next two days asking about your experience. I hope that your e-visit has been valuable and will speed your recovery.   I spent approximately 5 minutes reviewing the patient's history, current  symptoms and coordinating their care today.

## 2023-06-25 ENCOUNTER — Other Ambulatory Visit: Payer: Self-pay | Admitting: Family Medicine

## 2023-06-26 NOTE — Telephone Encounter (Signed)
Called left message to call back, 

## 2023-06-27 NOTE — Telephone Encounter (Signed)
Called again left message to call back Ok to deny this refill He does not answer his phone/does not return calls.

## 2023-06-27 NOTE — Telephone Encounter (Signed)
Called left message to call back 

## 2023-06-28 ENCOUNTER — Other Ambulatory Visit: Payer: Self-pay | Admitting: Family Medicine

## 2023-06-28 ENCOUNTER — Encounter: Payer: Self-pay | Admitting: Family Medicine

## 2023-06-28 ENCOUNTER — Other Ambulatory Visit (HOSPITAL_BASED_OUTPATIENT_CLINIC_OR_DEPARTMENT_OTHER): Payer: Self-pay

## 2023-06-28 ENCOUNTER — Ambulatory Visit: Payer: BC Managed Care – PPO | Admitting: Family Medicine

## 2023-06-28 VITALS — BP 120/86 | HR 65 | Temp 98.3°F | Ht 76.0 in | Wt 280.5 lb

## 2023-06-28 DIAGNOSIS — Z6834 Body mass index (BMI) 34.0-34.9, adult: Secondary | ICD-10-CM

## 2023-06-28 DIAGNOSIS — J189 Pneumonia, unspecified organism: Secondary | ICD-10-CM

## 2023-06-28 DIAGNOSIS — E669 Obesity, unspecified: Secondary | ICD-10-CM

## 2023-06-28 MED ORDER — DOXYCYCLINE HYCLATE 100 MG PO TABS
100.0000 mg | ORAL_TABLET | Freq: Two times a day (BID) | ORAL | 0 refills | Status: AC
  Filled 2023-06-28: qty 14, 7d supply, fill #0

## 2023-06-28 NOTE — Progress Notes (Signed)
Chief Complaint  Patient presents with   Follow-up   Cough    Cough for one month    Subjective: Patient is a 47 y.o. male here for f/u.  Started Wegovy 0.25 milligrams per week.  He reports compliance and no adverse effects.  The first week was great with reduction of appetite.  He lost around 7 pounds but has gained some back since then.  Diet is improving.  No routine exercise.  Duration: 4 weeks  Associated symptoms: wheezing, shortness of breath, and coughing, fatigue Denies: sinus congestion, sinus pain, rhinorrhea, itchy watery eyes, ear pain, ear drainage, sore throat, myalgia, and fevers Treatment to date: Tessalon Perles, Tylenol Sick contacts: Yes- coworker got him sick  Past Medical History:  Diagnosis Date   GAD (generalized anxiety disorder)    Gout    Hyperlipidemia     Objective: BP 120/86 (BP Location: Left Arm, Patient Position: Sitting, Cuff Size: Large)   Pulse 65   Temp 98.3 F (36.8 C) (Oral)   Ht 6\' 4"  (1.93 m)   Wt 280 lb 8 oz (127.2 kg)   SpO2 96%   BMI 34.14 kg/m  General: Awake, appears stated age Heart: RRR, no LE edema Lungs: CTAB, no rales, wheezes or rhonchi. No accessory muscle use HEENT: Canals are patent without otorrhea, TMs negative bilaterally, no sinus TTP, nares are patent without rhinorrhea, MMM, no pharyngeal exudate or erythema Psych: Age appropriate judgment and insight, normal affect and mood  Assessment and Plan: Obesity (BMI 30-39.9)  Walking pneumonia  Chronic, not controlled.  Need to increase dosage.  I recommended not skipping strengths as it could induce side effects.  Counseled on diet and exercise.  Follow-up in 4 months for physical. Given the duration and worsening of his symptoms, likely started as a viral infection and transition to bacterial.  Will send in 7 days of doxycycline.  Send message in 2 to 3 days if no improvement.  Would consider chest x-ray at that time. The patient voiced understanding and  agreement to the plan.  Jilda Roche [place], DO 06/28/23  11:54 AM

## 2023-06-28 NOTE — Patient Instructions (Signed)
Keep the diet clean and stay active.  Aim to do some physical exertion for 150 minutes per week. This is typically divided into 5 days per week, 30 minutes per day. The activity should be enough to get your heart rate up. Anything is better than nothing if you have time constraints.  Send me a message in 2-3 days if no improvement.   Continue to push fluids, practice good hand hygiene, and cover your mouth if you cough.  If you start having fevers, shaking or shortness of breath, seek immediate care.  Let us know if you need anything.

## 2023-07-03 ENCOUNTER — Ambulatory Visit: Payer: BC Managed Care – PPO | Admitting: Family Medicine

## 2023-07-31 ENCOUNTER — Other Ambulatory Visit (HOSPITAL_BASED_OUTPATIENT_CLINIC_OR_DEPARTMENT_OTHER): Payer: Self-pay

## 2023-07-31 ENCOUNTER — Encounter: Payer: Self-pay | Admitting: Family Medicine

## 2023-08-13 ENCOUNTER — Other Ambulatory Visit: Payer: Self-pay | Admitting: Family Medicine

## 2023-08-14 IMAGING — CT CT CARDIAC CORONARY ARTERY CALCIUM SCORE
3 series · 14 of 20 positions shown, 16 images · non-contrast
Comparison: None.
COMPARISON: None.

Addendum:
EXAM:
OVER-READ INTERPRETATION  CT CHEST

The following report is an over-read performed by radiologist Dr.
Kam Ip G Violet [REDACTED] on 12/12/2021. This over-read
does not include interpretation of cardiac or coronary anatomy or
pathology. The coronary calcium interpretation by the cardiologist
is attached.
CLINICAL DATA: Cardiovascular Disease Risk stratification
Coronary Calcium Score
TECHNIQUE: A gated, non-contrast computed tomography scan of the heart was
performed using 3mm slice thickness. Axial images were analyzed on a
dedicated workstation. Calcium scoring of the coronary arteries was
performed using the Agatston method.

[Series 2: cascseq 2.0 sa36 70% (id) · axial · 0.43mm/px · z∈[-292,-182]mm · 4 of 93 slices shown]
[im 19/93  vessel]
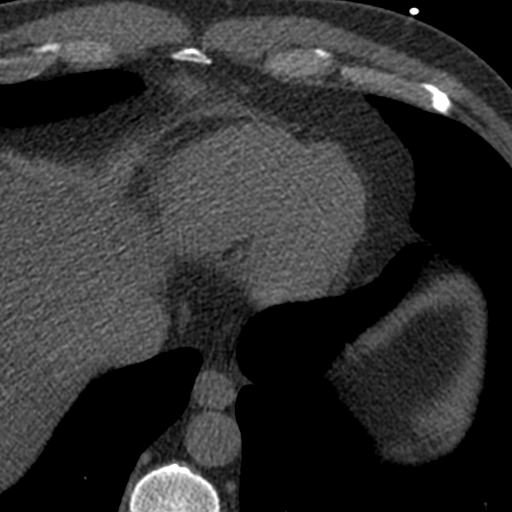
[im 37/93  vessel]
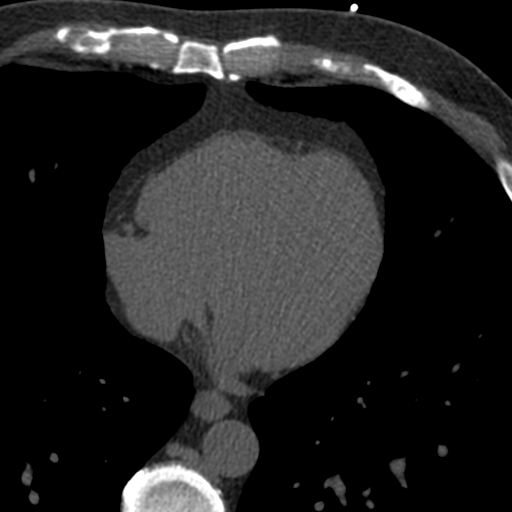
[im 56/93  vessel]
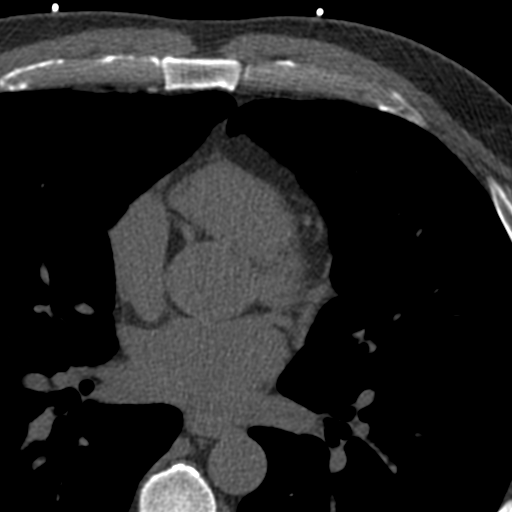
[im 74/93  vessel]
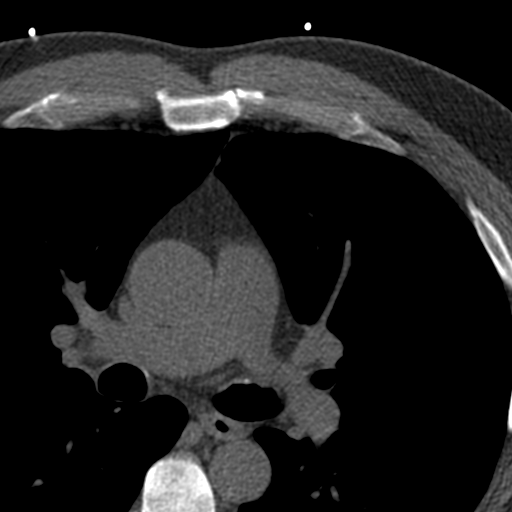

[Series 3: cascseq 2.0 bf37 st · axial · 0.83mm/px · z∈[-298,-176]mm · 5 of 93 slices shown, 7 images]
[im 16/93  vessel]
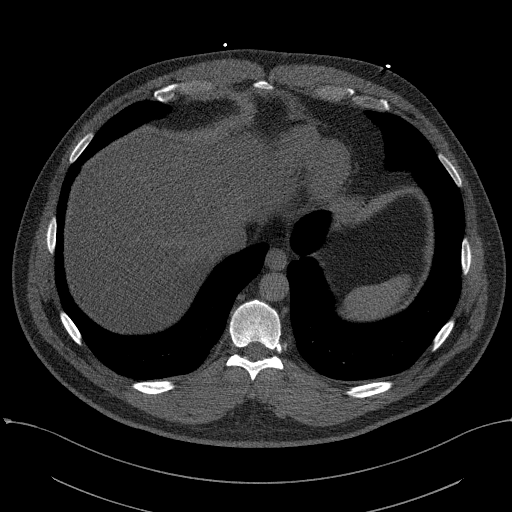
[im 16/93  lung]
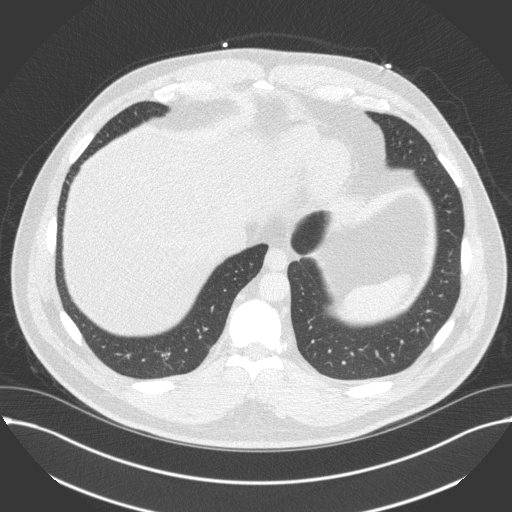
[im 31/93  vessel]
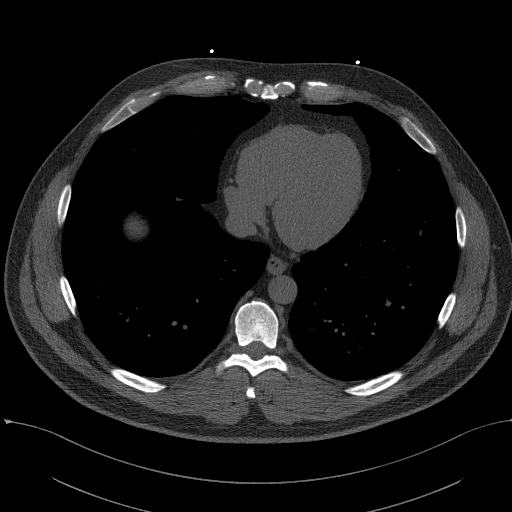
[im 47/93  vessel]
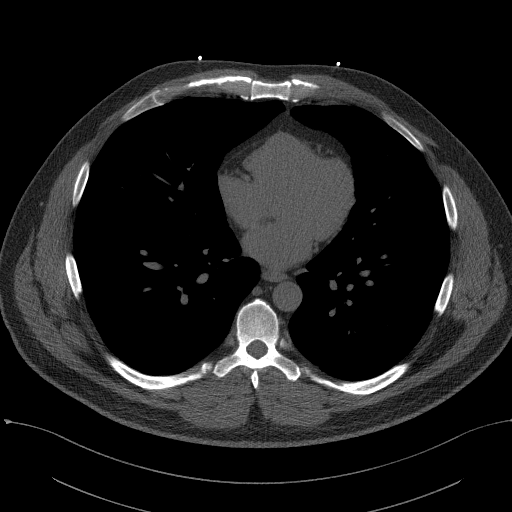
[im 62/93  vessel]
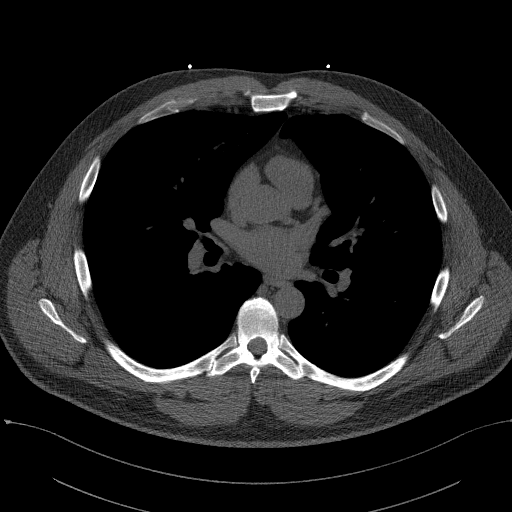
[im 77/93  vessel]
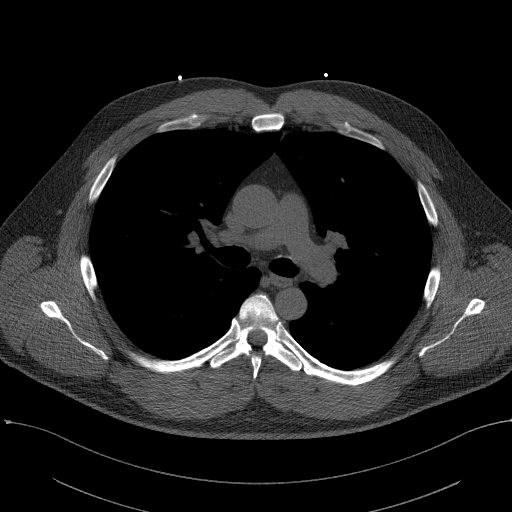
[im 77/93  lung]
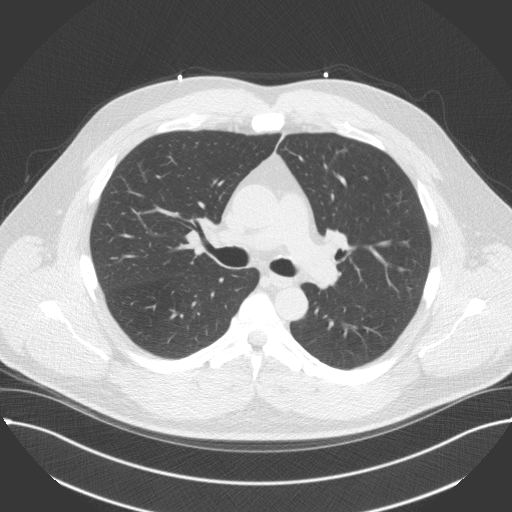

[Series 4: cascseq 2.0 br59 lung · axial · 0.83mm/px · z∈[-298,-176]mm · 5 of 93 slices shown]
[im 16/93  lung]
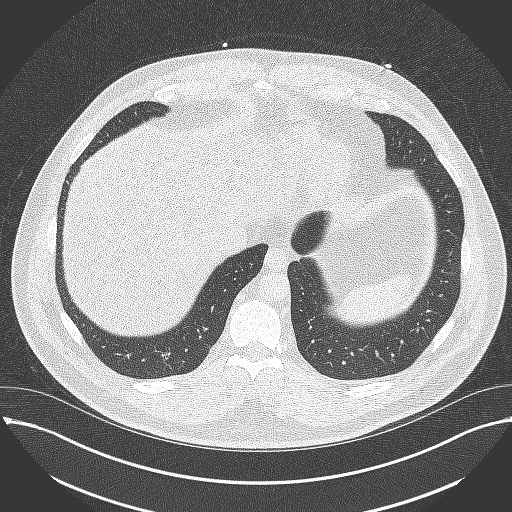
[im 31/93  lung]
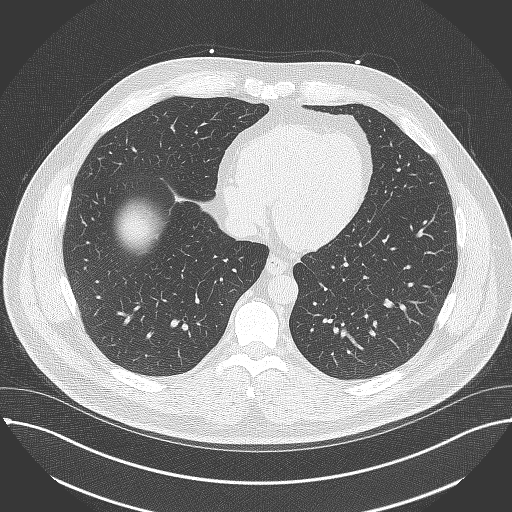
[im 47/93  lung]
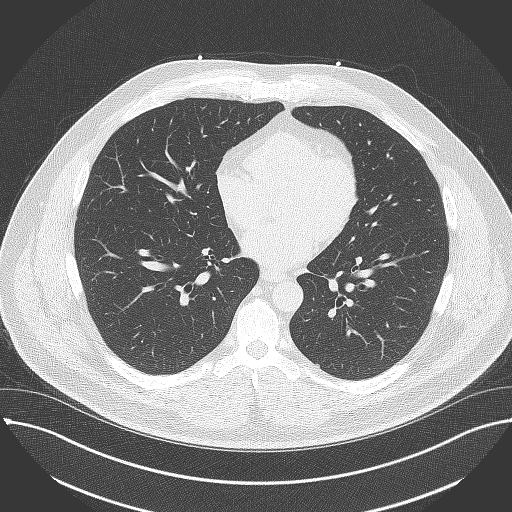
[im 62/93  lung]
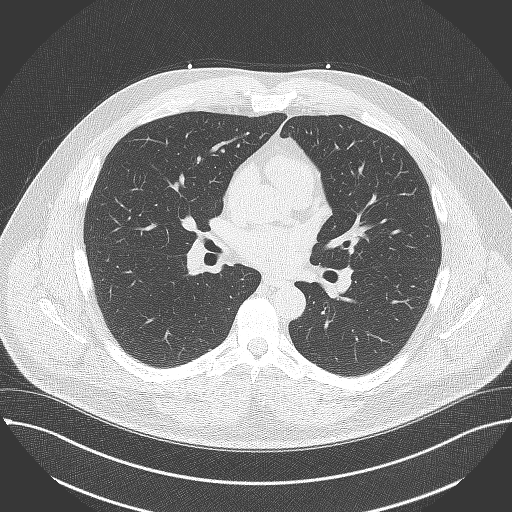
[im 77/93  lung]
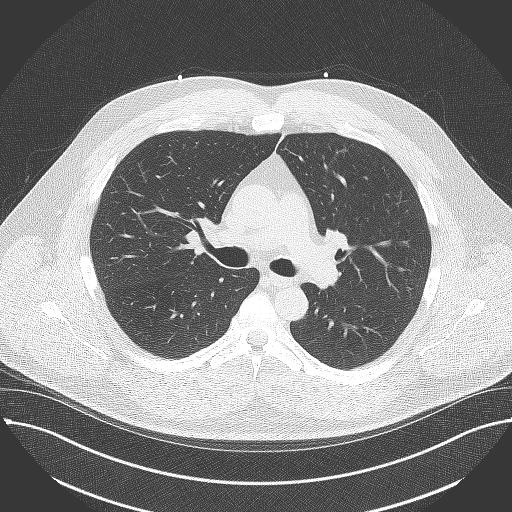

[14 of 20 positions shown; findings below may reference images not displayed]

FINDINGS: Vascular: Normal aortic caliber.

Mediastinum/Nodes: No imaged thoracic adenopathy.

Lungs/Pleura: No pleural fluid. Nodule along the right minor fissure
of 3 mm on 34/4. More anterior 3 mm Perifissural nodule on 39/4.
Subpleural 2 mm right middle lobe pulmonary nodule on 43/4.

Upper Abdomen: Moderate hepatic steatosis. Normal imaged portions of
the spleen, stomach.

Musculoskeletal: No acute osseous abnormality.
IMPRESSION: 1.  No acute findings in the imaged extracardiac chest.
2. Peri fissural nodules in the right middle lobe are most
consistent with subpleural lymph nodes.
3. Hepatic steatosis.
FINDINGS: Coronary arteries: Normal origins.

Coronary Calcium Score:

Left main: 0

Left anterior descending artery: 0

Left circumflex artery: 0

Right coronary artery: 31

Total: 31

Percentile: 88th

Pericardium: Normal.

Aorta: Normal caliber of ascending aorta. No aortic atherosclerosis
noted.

Non-cardiac: See separate report from [REDACTED].
IMPRESSION: Coronary calcium score of 31. This was 88th percentile for age-,
race-, and sex-matched controls.



If CAC=0, it is reasonable to withhold statin therapy and reassess
in 5 to 10 years, as long as higher risk conditions are absent
(diabetes mellitus, family history of premature CHD in first degree
relatives (males <55 years; females <65 years), cigarette smoking,
or LDL >=190 mg/dL).

If CAC is 1 to 99, it is reasonable to initiate statin therapy for
patients >=55 years of age.

If CAC is >=100 or >=75th percentile, it is reasonable to initiate
statin therapy at any age.

Cardiology referral should be considered for patients with CAC
scores >=400 or >=75th percentile.

*1977 AHA/ACC/AACVPR/AAPA/ABC/NICK GRAZIELLA/MONTILLA/NOVA/Sasaki/INUTSUKA/JUMPER/GILBERT
Guideline on the Management of Blood Cholesterol: A Report of the
American College of Cardiology/American Heart Association Task Force
on Clinical Practice Guidelines. J Am Coll Cardiol.
5740;73(24):3099-3343.

*** End of Addendum ***
EXAM:
OVER-READ INTERPRETATION  CT CHEST

The following report is an over-read performed by radiologist Dr.
Kam Ip G Violet [REDACTED] on 12/12/2021. This over-read
does not include interpretation of cardiac or coronary anatomy or
pathology. The coronary calcium interpretation by the cardiologist
is attached.
FINDINGS: Vascular: Normal aortic caliber.

Mediastinum/Nodes: No imaged thoracic adenopathy.

Lungs/Pleura: No pleural fluid. Nodule along the right minor fissure
of 3 mm on 34/4. More anterior 3 mm Perifissural nodule on 39/4.
Subpleural 2 mm right middle lobe pulmonary nodule on 43/4.

Upper Abdomen: Moderate hepatic steatosis. Normal imaged portions of
the spleen, stomach.

Musculoskeletal: No acute osseous abnormality.
IMPRESSION: 1.  No acute findings in the imaged extracardiac chest.
2. Peri fissural nodules in the right middle lobe are most
consistent with subpleural lymph nodes.
3. Hepatic steatosis.

## 2023-08-20 ENCOUNTER — Ambulatory Visit: Payer: BC Managed Care – PPO | Admitting: Family Medicine

## 2023-08-21 ENCOUNTER — Other Ambulatory Visit (HOSPITAL_BASED_OUTPATIENT_CLINIC_OR_DEPARTMENT_OTHER): Payer: Self-pay

## 2023-08-21 ENCOUNTER — Ambulatory Visit (INDEPENDENT_AMBULATORY_CARE_PROVIDER_SITE_OTHER): Payer: BC Managed Care – PPO | Admitting: Family Medicine

## 2023-08-21 ENCOUNTER — Encounter: Payer: Self-pay | Admitting: Family Medicine

## 2023-08-21 VITALS — BP 120/80 | HR 71 | Temp 98.1°F | Ht 76.0 in | Wt 275.2 lb

## 2023-08-21 DIAGNOSIS — Z9109 Other allergy status, other than to drugs and biological substances: Secondary | ICD-10-CM

## 2023-08-21 DIAGNOSIS — R361 Hematospermia: Secondary | ICD-10-CM

## 2023-08-21 DIAGNOSIS — E669 Obesity, unspecified: Secondary | ICD-10-CM | POA: Diagnosis not present

## 2023-08-21 DIAGNOSIS — F411 Generalized anxiety disorder: Secondary | ICD-10-CM

## 2023-08-21 DIAGNOSIS — E7800 Pure hypercholesterolemia, unspecified: Secondary | ICD-10-CM

## 2023-08-21 LAB — URINALYSIS, MICROSCOPIC ONLY

## 2023-08-21 LAB — LIPID PANEL
Cholesterol: 126 mg/dL (ref 0–200)
HDL: 33.2 mg/dL — ABNORMAL LOW (ref >39.00)
LDL Cholesterol: 46 mg/dL (ref 0–99)
NonHDL: 92.66
Total CHOL/HDL Ratio: 4
Triglycerides: 234 mg/dL — ABNORMAL HIGH (ref 0.0–149.0)
VLDL: 46.8 mg/dL — ABNORMAL HIGH (ref 0.0–40.0)

## 2023-08-21 LAB — COMPREHENSIVE METABOLIC PANEL
ALT: 66 U/L — ABNORMAL HIGH (ref 0–53)
AST: 30 U/L (ref 0–37)
Albumin: 4.5 g/dL (ref 3.5–5.2)
Alkaline Phosphatase: 55 U/L (ref 39–117)
BUN: 18 mg/dL (ref 6–23)
CO2: 30 mEq/L (ref 19–32)
Calcium: 10.1 mg/dL (ref 8.4–10.5)
Chloride: 103 mEq/L (ref 96–112)
Creatinine, Ser: 1.2 mg/dL (ref 0.40–1.50)
GFR: 72 mL/min (ref >60.00)
Glucose, Bld: 80 mg/dL (ref 70–99)
Potassium: 4.5 mEq/L (ref 3.5–5.1)
Sodium: 142 mEq/L (ref 135–145)
Total Bilirubin: 0.7 mg/dL (ref 0.2–1.2)
Total Protein: 6.9 g/dL (ref 6.0–8.3)

## 2023-08-21 MED ORDER — ROSUVASTATIN CALCIUM 20 MG PO TABS
20.0000 mg | ORAL_TABLET | Freq: Every day | ORAL | 0 refills | Status: DC
  Filled 2023-08-21: qty 90, 90d supply, fill #0

## 2023-08-21 MED ORDER — IPRATROPIUM BROMIDE 0.03 % NA SOLN: 2.0000 | Freq: Two times a day (BID) | NASAL | Status: DC

## 2023-08-21 MED ORDER — BUPROPION HCL ER (XL) 150 MG PO TB24
150.0000 mg | ORAL_TABLET | Freq: Every day | ORAL | 1 refills | Status: DC
  Filled 2023-08-21 – 2023-10-15 (×2): qty 90, 90d supply, fill #0
  Filled 2024-01-30: qty 90, 90d supply, fill #1

## 2023-08-21 MED ORDER — ESCITALOPRAM OXALATE 20 MG PO TABS
20.0000 mg | ORAL_TABLET | Freq: Every day | ORAL | 3 refills | Status: DC
  Filled 2023-08-21: qty 90, 90d supply, fill #0

## 2023-08-21 MED ORDER — SEMAGLUTIDE-WEIGHT MANAGEMENT 1 MG/0.5ML ~~LOC~~ SOAJ
1.0000 mg | SUBCUTANEOUS | 0 refills | Status: DC
Start: 2023-08-21 — End: 2023-11-14
  Filled 2023-08-21: qty 6, 84d supply, fill #0

## 2023-08-21 NOTE — Progress Notes (Signed)
Chief Complaint  Patient presents with   Follow-up    Wegovy Patient was laid off and needs refills to cover  Do labs today      Subjective: Patient is a 47 y.o. male here for f/u.  Lost his job and ins cancels out next week. Would like refills before that happens. He is on Wegovy 1 mg/week currently. Down 10 lbs since starting. Diet healthy, appetite decreased. Cycling, walking.   Hyperlipidemia Patient presents for dyslipidemia follow up. Currently being treated with Crestor 20 mg/d and compliance with treatment thus far has been good. He denies myalgias. Diet/exercise as above.  No CP or SOB.  The patient is not known to have coexisting coronary artery disease.  GAD Taking Wellbutrin XL 150 mg/d, Lexapro 20 mg/d. Compliant, no AE's. Doing well.   Past Medical History:  Diagnosis Date   GAD (generalized anxiety disorder)    Gout    Hyperlipidemia     Objective: BP 120/80 (BP Location: Left Arm, Patient Position: Sitting, Cuff Size: Large)   Pulse 71   Temp 98.1 F (36.7 C) (Oral)   Ht 6\' 4"  (1.93 m)   Wt 275 lb 4 oz (124.9 kg)   SpO2 97%   BMI 33.50 kg/m  General: Awake, appears stated age Heart: RRR, no LE edema Lungs: CTAB, no rales, wheezes or rhonchi. No accessory muscle use Psych: Age appropriate judgment and insight, normal affect and mood  Assessment and Plan: Hematospermia - Plan: Urine Microscopic Only  Obesity (BMI 30-39.9) - Plan: Semaglutide-Weight Management 1 MG/0.5ML SOAJ  Pure hypercholesterolemia - Plan: Lipid panel, Comprehensive metabolic panel  GAD (generalized anxiety disorder)  Environmental allergies - Plan: ipratropium (ATROVENT) 0.03 % nasal spray  Single episode. No issues after that. No blood in urine.  Will check urine microscopy. Chronic, improving.  Counseled on diet and exercise.  Continue Wegovy 1 mg weekly.  Will see if his new insurance will cover this medication. Chronic, stable.  Check labs.  Continue Crestor 20 mg  daily. Chronic, stable.  Continue Lexapro 20 mg daily, Wellbutrin XL 150 mg daily. Follow-up in 6 months for physical or as needed. The patient voiced understanding and agreement to the plan.  Jilda Roche [place], DO 08/21/23  11:46 AM

## 2023-08-21 NOTE — Patient Instructions (Signed)
Keep the diet clean and stay active.  Let us know if you need anything. 

## 2023-08-31 ENCOUNTER — Other Ambulatory Visit: Payer: Self-pay | Admitting: Family Medicine

## 2023-08-31 DIAGNOSIS — F411 Generalized anxiety disorder: Secondary | ICD-10-CM

## 2023-10-15 ENCOUNTER — Other Ambulatory Visit: Payer: Self-pay

## 2023-10-15 ENCOUNTER — Other Ambulatory Visit (HOSPITAL_COMMUNITY): Payer: Self-pay

## 2023-10-16 ENCOUNTER — Other Ambulatory Visit: Payer: Self-pay

## 2023-10-16 ENCOUNTER — Other Ambulatory Visit (HOSPITAL_COMMUNITY): Payer: Self-pay

## 2023-11-14 ENCOUNTER — Other Ambulatory Visit: Payer: Self-pay | Admitting: Family Medicine

## 2023-11-14 ENCOUNTER — Other Ambulatory Visit (HOSPITAL_BASED_OUTPATIENT_CLINIC_OR_DEPARTMENT_OTHER): Payer: Self-pay

## 2023-11-14 ENCOUNTER — Telehealth: Payer: Self-pay

## 2023-11-14 DIAGNOSIS — E669 Obesity, unspecified: Secondary | ICD-10-CM

## 2023-11-14 MED ORDER — WEGOVY 1.7 MG/0.75ML ~~LOC~~ SOAJ
1.7000 mg | SUBCUTANEOUS | 0 refills | Status: DC
Start: 2023-11-14 — End: 2023-12-24
  Filled 2023-11-14: qty 3, 28d supply, fill #0

## 2023-11-14 NOTE — Telephone Encounter (Signed)
Needs updated OV for Wt and BMI check for [facility] PA.

## 2023-11-15 NOTE — Telephone Encounter (Signed)
Called pt lvm and sent message letting him know office visit Needs updated OV for Wt and BMI check for [facility] PA.

## 2023-11-18 ENCOUNTER — Encounter: Payer: Self-pay | Admitting: Family Medicine

## 2023-11-18 ENCOUNTER — Ambulatory Visit (INDEPENDENT_AMBULATORY_CARE_PROVIDER_SITE_OTHER): Payer: Self-pay | Admitting: Family Medicine

## 2023-11-18 ENCOUNTER — Other Ambulatory Visit: Payer: Self-pay | Admitting: Family Medicine

## 2023-11-18 ENCOUNTER — Other Ambulatory Visit (HOSPITAL_BASED_OUTPATIENT_CLINIC_OR_DEPARTMENT_OTHER): Payer: Self-pay

## 2023-11-18 VITALS — BP 114/70 | HR 79 | Temp 98.1°F | Resp 16 | Ht 76.0 in | Wt 268.0 lb

## 2023-11-18 DIAGNOSIS — E669 Obesity, unspecified: Secondary | ICD-10-CM

## 2023-11-18 NOTE — Patient Instructions (Signed)
Keep the diet clean and stay active.  Aim to do some physical exertion for 150 minutes per week. This is typically divided into 5 days per week, 30 minutes per day. The activity should be enough to get your heart rate up. Anything is better than nothing if you have time constraints.  Let us know if you need anything.

## 2023-11-18 NOTE — Progress Notes (Signed)
Chief Complaint  Patient presents with   Weight Check    Subjective: Patient is a 47 y.o. male here for f/u.  Currently on Wegovy 1 mg/week. Reports compliance, no current AE's. Diet is improving. Cravings and appetite are decreased. No exercise currently.   Past Medical History:  Diagnosis Date   GAD (generalized anxiety disorder)    Gout    Hyperlipidemia     Objective: BP 114/70 (BP Location: Left Arm, Patient Position: Sitting, Cuff Size: Large)   Pulse 79   Temp 98.1 F (36.7 C) (Oral)   Resp 16   Ht 6\' 4"  (1.93 m)   Wt 268 lb (121.6 kg)   SpO2 95%   BMI 32.62 kg/m  General: Awake, appears stated age Heart: RRR, no LE edema Lungs: CTAB, no rales, wheezes or rhonchi. No accessory muscle use Psych: Age appropriate judgment and insight, normal affect and mood  Assessment and Plan: Obesity (BMI 30-39.9)  Chronic, stable/improving.  Continue to [facility], he is due for the 1.7 mg dosage now.  Counseled on diet and exercise.  He has lost greater than 5% of his body weight since starting treatment.  Follow-up in 6 months or as needed. The patient voiced understanding and agreement to the plan.  Jilda Roche [place], DO 11/18/23  4:34 PM

## 2023-11-19 ENCOUNTER — Other Ambulatory Visit (HOSPITAL_BASED_OUTPATIENT_CLINIC_OR_DEPARTMENT_OTHER): Payer: Self-pay

## 2023-11-19 NOTE — Telephone Encounter (Signed)
PA initiated via Covermymeds; KEY: [id number]. Awaiting determination.

## 2023-11-19 NOTE — Telephone Encounter (Signed)
Sent pt message letting him know just waiting on insurance to approve.

## 2023-11-21 ENCOUNTER — Other Ambulatory Visit (HOSPITAL_BASED_OUTPATIENT_CLINIC_OR_DEPARTMENT_OTHER): Payer: Self-pay

## 2023-11-22 ENCOUNTER — Other Ambulatory Visit (HOSPITAL_BASED_OUTPATIENT_CLINIC_OR_DEPARTMENT_OTHER): Payer: Self-pay

## 2023-11-25 ENCOUNTER — Other Ambulatory Visit (HOSPITAL_BASED_OUTPATIENT_CLINIC_OR_DEPARTMENT_OTHER): Payer: Self-pay

## 2023-11-25 NOTE — Telephone Encounter (Signed)
Further questions received, answered and faxed back to [facility] at [phone].

## 2023-11-26 ENCOUNTER — Other Ambulatory Visit (HOSPITAL_BASED_OUTPATIENT_CLINIC_OR_DEPARTMENT_OTHER): Payer: Self-pay

## 2023-11-26 ENCOUNTER — Encounter (HOSPITAL_BASED_OUTPATIENT_CLINIC_OR_DEPARTMENT_OTHER): Payer: Self-pay

## 2023-11-26 NOTE — Telephone Encounter (Signed)
 PA approved.   Message from plan: Approved. . Authorization Expiration Date: November 18, 2024.

## 2023-12-05 DIAGNOSIS — L400 Psoriasis vulgaris: Secondary | ICD-10-CM | POA: Diagnosis not present

## 2023-12-24 ENCOUNTER — Other Ambulatory Visit (HOSPITAL_BASED_OUTPATIENT_CLINIC_OR_DEPARTMENT_OTHER): Payer: Self-pay

## 2023-12-24 ENCOUNTER — Other Ambulatory Visit: Payer: Self-pay | Admitting: Family Medicine

## 2023-12-25 ENCOUNTER — Other Ambulatory Visit (HOSPITAL_BASED_OUTPATIENT_CLINIC_OR_DEPARTMENT_OTHER): Payer: Self-pay

## 2023-12-25 ENCOUNTER — Encounter: Payer: Self-pay | Admitting: Family Medicine

## 2023-12-25 ENCOUNTER — Encounter (HOSPITAL_BASED_OUTPATIENT_CLINIC_OR_DEPARTMENT_OTHER): Payer: Self-pay

## 2023-12-25 ENCOUNTER — Other Ambulatory Visit: Payer: Self-pay | Admitting: Family Medicine

## 2023-12-25 MED ORDER — WEGOVY 1.7 MG/0.75ML ~~LOC~~ SOAJ
1.7000 mg | SUBCUTANEOUS | 0 refills | Status: DC
  Filled 2023-12-25: qty 3, 28d supply, fill #0

## 2023-12-25 MED ORDER — SEMAGLUTIDE-WEIGHT MANAGEMENT 2.4 MG/0.75ML ~~LOC~~ SOAJ
2.4000 mg | SUBCUTANEOUS | 4 refills | Status: DC
  Filled 2023-12-25: qty 3, 28d supply, fill #0
  Filled 2024-01-28: qty 3, 28d supply, fill #1
  Filled 2024-02-18 – 2024-06-16 (×3): qty 3, 28d supply, fill #2

## 2023-12-25 MED ORDER — SEMAGLUTIDE-WEIGHT MANAGEMENT 2.4 MG/0.75ML ~~LOC~~ SOAJ: 2.4000 mg | SUBCUTANEOUS | 5 refills | Status: DC

## 2023-12-26 ENCOUNTER — Other Ambulatory Visit (HOSPITAL_BASED_OUTPATIENT_CLINIC_OR_DEPARTMENT_OTHER): Payer: Self-pay

## 2024-01-28 ENCOUNTER — Other Ambulatory Visit (HOSPITAL_BASED_OUTPATIENT_CLINIC_OR_DEPARTMENT_OTHER): Payer: Self-pay

## 2024-02-11 ENCOUNTER — Encounter: Payer: Self-pay | Admitting: Family Medicine

## 2024-02-11 MED ORDER — ROSUVASTATIN CALCIUM 20 MG PO TABS: 20.0000 mg | ORAL_TABLET | Freq: Every day | ORAL | 3 refills | Status: DC

## 2024-02-28 ENCOUNTER — Other Ambulatory Visit (HOSPITAL_BASED_OUTPATIENT_CLINIC_OR_DEPARTMENT_OTHER): Payer: Self-pay

## 2024-03-02 ENCOUNTER — Other Ambulatory Visit (HOSPITAL_BASED_OUTPATIENT_CLINIC_OR_DEPARTMENT_OTHER): Payer: Self-pay

## 2024-03-03 ENCOUNTER — Other Ambulatory Visit (HOSPITAL_BASED_OUTPATIENT_CLINIC_OR_DEPARTMENT_OTHER): Payer: Self-pay

## 2024-03-04 ENCOUNTER — Other Ambulatory Visit (HOSPITAL_COMMUNITY): Payer: Self-pay

## 2024-03-04 ENCOUNTER — Telehealth: Payer: Self-pay | Admitting: Pharmacy Technician

## 2024-03-04 ENCOUNTER — Other Ambulatory Visit (HOSPITAL_BASED_OUTPATIENT_CLINIC_OR_DEPARTMENT_OTHER): Payer: Self-pay

## 2024-03-04 NOTE — Telephone Encounter (Signed)
 Pharmacy Patient Advocate Encounter   Received notification from CoverMyMeds that prior authorization for [facility] 2.4MG /0.75ML auto-injectors is required/requested.   Insurance verification completed.   The patient is insured through [facility] .   Per test claim: PA required; PA submitted to above mentioned insurance via CoverMyMeds Key/confirmation #/EOC [facility] Status is pending

## 2024-03-05 ENCOUNTER — Other Ambulatory Visit (HOSPITAL_BASED_OUTPATIENT_CLINIC_OR_DEPARTMENT_OTHER): Payer: Self-pay

## 2024-03-05 NOTE — Telephone Encounter (Signed)
 Pharmacy Patient Advocate Encounter  Received notification from [facility] Medicaid that Prior Authorization for [facility] 2.4MG /0.75ML auto-injectors has been DENIED.  Full denial letter will be uploaded to the media tab. See denial reason below.   PA #/Case ID/Reference #: [id number]  Please bring patient in for an office visit for a weight and BMI check to be submitted to the insurance.

## 2024-03-10 ENCOUNTER — Other Ambulatory Visit: Payer: Self-pay | Admitting: Family Medicine

## 2024-03-10 DIAGNOSIS — F411 Generalized anxiety disorder: Secondary | ICD-10-CM

## 2024-03-11 MED ORDER — ESCITALOPRAM OXALATE 20 MG PO TABS: 20.0000 mg | ORAL_TABLET | Freq: Every day | ORAL | 2 refills | Status: DC

## 2024-03-11 NOTE — Addendum Note (Signed)
 Addended by: Sultana Tierney M on: 03/11/2024 09:42 AM   Modules accepted: Orders

## 2024-03-12 ENCOUNTER — Other Ambulatory Visit (HOSPITAL_BASED_OUTPATIENT_CLINIC_OR_DEPARTMENT_OTHER): Payer: Self-pay

## 2024-04-23 ENCOUNTER — Other Ambulatory Visit (HOSPITAL_BASED_OUTPATIENT_CLINIC_OR_DEPARTMENT_OTHER): Payer: Self-pay

## 2024-04-23 ENCOUNTER — Other Ambulatory Visit: Payer: Self-pay | Admitting: Family Medicine

## 2024-04-23 DIAGNOSIS — F411 Generalized anxiety disorder: Secondary | ICD-10-CM

## 2024-04-23 MED ORDER — BUPROPION HCL ER (XL) 150 MG PO TB24
150.0000 mg | ORAL_TABLET | Freq: Every day | ORAL | 1 refills | Status: DC
  Filled 2024-06-16: qty 30, 30d supply, fill #0
  Filled 2024-06-22: qty 30, 30d supply, fill #1

## 2024-06-16 ENCOUNTER — Other Ambulatory Visit (HOSPITAL_COMMUNITY): Payer: Self-pay

## 2024-06-16 ENCOUNTER — Encounter (HOSPITAL_BASED_OUTPATIENT_CLINIC_OR_DEPARTMENT_OTHER): Payer: Self-pay

## 2024-06-16 ENCOUNTER — Other Ambulatory Visit (HOSPITAL_BASED_OUTPATIENT_CLINIC_OR_DEPARTMENT_OTHER): Payer: Self-pay

## 2024-06-16 ENCOUNTER — Telehealth: Payer: Self-pay

## 2024-06-16 NOTE — Telephone Encounter (Signed)
 Pharmacy Patient Advocate Encounter   Received notification from CoverMyMeds that prior authorization for Wegovy  2.4 is required/requested.   Insurance verification completed.   The patient is insured through [facility] .   Per test claim: PA required; PA submitted to above mentioned insurance via CoverMyMeds Key/confirmation #/EOC [id number] Status is pending

## 2024-06-17 ENCOUNTER — Other Ambulatory Visit (HOSPITAL_BASED_OUTPATIENT_CLINIC_OR_DEPARTMENT_OTHER): Payer: Self-pay

## 2024-06-17 ENCOUNTER — Other Ambulatory Visit (HOSPITAL_COMMUNITY): Payer: Self-pay

## 2024-06-17 NOTE — Telephone Encounter (Addendum)
 Patient Raymond Gamble has a plan benefit exclusion for this medication. [employer] says they are the secondary payer and Raymond Gamble should be billed. Next, patient has not been seen since 11/18/23 so a new weight and BMI within 45 days is also needed.Please advise

## 2024-06-17 NOTE — Telephone Encounter (Signed)
 Pharmacy Patient Advocate Encounter  Received notification from [facility] that Prior Authorization for  Wegovy  2.4  has been DENIED.  Full denial letter will be uploaded to the media tab. See denial reason below.   PA #/Case ID/Reference #: [id number]

## 2024-06-18 ENCOUNTER — Other Ambulatory Visit (HOSPITAL_BASED_OUTPATIENT_CLINIC_OR_DEPARTMENT_OTHER): Payer: Self-pay

## 2024-06-19 ENCOUNTER — Other Ambulatory Visit (HOSPITAL_BASED_OUTPATIENT_CLINIC_OR_DEPARTMENT_OTHER): Payer: Self-pay

## 2024-06-19 NOTE — Telephone Encounter (Signed)
 Sent pt mychart message @ Wegovy  and needing to setup.

## 2024-06-22 ENCOUNTER — Other Ambulatory Visit (HOSPITAL_BASED_OUTPATIENT_CLINIC_OR_DEPARTMENT_OTHER): Payer: Self-pay

## 2024-06-23 ENCOUNTER — Other Ambulatory Visit (HOSPITAL_BASED_OUTPATIENT_CLINIC_OR_DEPARTMENT_OTHER): Payer: Self-pay

## 2024-06-25 ENCOUNTER — Other Ambulatory Visit (HOSPITAL_BASED_OUTPATIENT_CLINIC_OR_DEPARTMENT_OTHER): Payer: Self-pay

## 2024-06-25 ENCOUNTER — Other Ambulatory Visit: Payer: Self-pay | Admitting: Family Medicine

## 2024-06-25 DIAGNOSIS — F411 Generalized anxiety disorder: Secondary | ICD-10-CM

## 2024-06-26 ENCOUNTER — Other Ambulatory Visit: Payer: Self-pay | Admitting: Family Medicine

## 2024-06-26 ENCOUNTER — Other Ambulatory Visit (HOSPITAL_BASED_OUTPATIENT_CLINIC_OR_DEPARTMENT_OTHER): Payer: Self-pay

## 2024-06-26 DIAGNOSIS — F411 Generalized anxiety disorder: Secondary | ICD-10-CM

## 2024-06-27 ENCOUNTER — Encounter: Payer: Self-pay | Admitting: Gastroenterology

## 2024-06-29 ENCOUNTER — Other Ambulatory Visit (HOSPITAL_COMMUNITY): Payer: Self-pay

## 2024-06-29 ENCOUNTER — Other Ambulatory Visit (HOSPITAL_BASED_OUTPATIENT_CLINIC_OR_DEPARTMENT_OTHER): Payer: Self-pay

## 2024-06-30 ENCOUNTER — Other Ambulatory Visit (HOSPITAL_BASED_OUTPATIENT_CLINIC_OR_DEPARTMENT_OTHER): Payer: Self-pay

## 2024-07-01 ENCOUNTER — Other Ambulatory Visit (HOSPITAL_BASED_OUTPATIENT_CLINIC_OR_DEPARTMENT_OTHER): Payer: Self-pay

## 2024-07-02 ENCOUNTER — Other Ambulatory Visit (HOSPITAL_BASED_OUTPATIENT_CLINIC_OR_DEPARTMENT_OTHER): Payer: Self-pay

## 2024-07-03 ENCOUNTER — Other Ambulatory Visit (HOSPITAL_BASED_OUTPATIENT_CLINIC_OR_DEPARTMENT_OTHER): Payer: Self-pay

## 2024-07-06 ENCOUNTER — Other Ambulatory Visit (HOSPITAL_BASED_OUTPATIENT_CLINIC_OR_DEPARTMENT_OTHER): Payer: Self-pay

## 2024-07-07 ENCOUNTER — Other Ambulatory Visit (HOSPITAL_BASED_OUTPATIENT_CLINIC_OR_DEPARTMENT_OTHER): Payer: Self-pay

## 2024-07-08 ENCOUNTER — Other Ambulatory Visit (HOSPITAL_BASED_OUTPATIENT_CLINIC_OR_DEPARTMENT_OTHER): Payer: Self-pay

## 2024-07-09 ENCOUNTER — Other Ambulatory Visit (HOSPITAL_BASED_OUTPATIENT_CLINIC_OR_DEPARTMENT_OTHER): Payer: Self-pay

## 2024-07-10 ENCOUNTER — Other Ambulatory Visit (HOSPITAL_BASED_OUTPATIENT_CLINIC_OR_DEPARTMENT_OTHER): Payer: Self-pay

## 2024-07-13 ENCOUNTER — Other Ambulatory Visit (HOSPITAL_BASED_OUTPATIENT_CLINIC_OR_DEPARTMENT_OTHER): Payer: Self-pay

## 2024-07-14 ENCOUNTER — Other Ambulatory Visit (HOSPITAL_BASED_OUTPATIENT_CLINIC_OR_DEPARTMENT_OTHER): Payer: Self-pay

## 2024-07-15 ENCOUNTER — Other Ambulatory Visit (HOSPITAL_BASED_OUTPATIENT_CLINIC_OR_DEPARTMENT_OTHER): Payer: Self-pay

## 2024-07-16 ENCOUNTER — Other Ambulatory Visit (HOSPITAL_BASED_OUTPATIENT_CLINIC_OR_DEPARTMENT_OTHER): Payer: Self-pay

## 2024-07-18 ENCOUNTER — Other Ambulatory Visit (HOSPITAL_BASED_OUTPATIENT_CLINIC_OR_DEPARTMENT_OTHER): Payer: Self-pay

## 2024-07-20 ENCOUNTER — Other Ambulatory Visit (HOSPITAL_BASED_OUTPATIENT_CLINIC_OR_DEPARTMENT_OTHER): Payer: Self-pay

## 2024-07-21 ENCOUNTER — Other Ambulatory Visit (HOSPITAL_BASED_OUTPATIENT_CLINIC_OR_DEPARTMENT_OTHER): Payer: Self-pay

## 2024-07-22 ENCOUNTER — Other Ambulatory Visit (HOSPITAL_BASED_OUTPATIENT_CLINIC_OR_DEPARTMENT_OTHER): Payer: Self-pay

## 2024-07-23 ENCOUNTER — Other Ambulatory Visit (HOSPITAL_BASED_OUTPATIENT_CLINIC_OR_DEPARTMENT_OTHER): Payer: Self-pay

## 2024-09-30 ENCOUNTER — Other Ambulatory Visit: Payer: Self-pay | Admitting: Family Medicine

## 2024-09-30 DIAGNOSIS — F411 Generalized anxiety disorder: Secondary | ICD-10-CM

## 2024-11-09 ENCOUNTER — Telehealth: Admitting: Physician Assistant

## 2024-11-09 ENCOUNTER — Telehealth: Admitting: Family Medicine

## 2024-11-09 DIAGNOSIS — H10022 Other mucopurulent conjunctivitis, left eye: Secondary | ICD-10-CM | POA: Diagnosis not present

## 2024-11-09 DIAGNOSIS — B9689 Other specified bacterial agents as the cause of diseases classified elsewhere: Secondary | ICD-10-CM

## 2024-11-09 MED ORDER — POLYMYXIN B-TRIMETHOPRIM 10000-0.1 UNIT/ML-% OP SOLN: 1.0000 [drp] | Freq: Four times a day (QID) | OPHTHALMIC | 0 refills | Status: DC

## 2024-11-09 MED ORDER — AMOXICILLIN-POT CLAVULANATE 875-125 MG PO TABS: 1.0000 | ORAL_TABLET | Freq: Two times a day (BID) | ORAL | 0 refills | Status: DC

## 2024-11-09 NOTE — Progress Notes (Signed)
 E-Visit for [employer]   We are sorry that you are not feeling well.  Here is how we plan to help!  Based on what you have shared with me it looks like you have conjunctivitis.  Conjunctivitis is a common inflammatory or infectious condition of the eye that is often referred to as pink eye.  In most cases it is contagious (viral or bacterial). However, not all conjunctivitis requires antibiotics (ex. Allergic).  We have made appropriate suggestions for you based upon your presentation.  I have prescribed Polytrim Ophthalmic drops 1-2 drops 4 times a day times 5 days  Pink eye can be highly contagious.  It is typically spread through direct contact with secretions, or contaminated objects or surfaces that one may have touched.  Strict handwashing is suggested with soap and water is urged.  If not available, use alcohol based had sanitizer.  Avoid unnecessary touching of the eye.  If you wear contact lenses, you will need to refrain from wearing them until you see no white discharge from the eye for at least 24 hours after being on medication.  You should see symptom improvement in 1-2 days after starting the medication regimen.  Call us  if symptoms are not improved in 1-2 days.  Home Care: Wash your hands often! Do not wear your contacts until you complete your treatment plan. Avoid sharing towels, bed linen, personal items with a person who has pink eye. See attention for anyone in your home with similar symptoms.  Get Help Right Away If: Your symptoms do not improve. You develop blurred or loss of vision. Your symptoms worsen (increased discharge, pain or redness)   Thank you for choosing an e-visit.  Your e-visit answers were reviewed by a board certified advanced clinical practitioner to complete your personal care plan. Depending upon the condition, your plan could have included both over the counter or prescription medications.  Please review your pharmacy choice. Make sure the  pharmacy is open so you can pick up prescription now. If there is a problem, you may contact your provider through [employer] and have the prescription routed to another pharmacy.  Your safety is important to us . If you have drug allergies check your prescription carefully.   For the next 24 hours you can use MyChart to ask questions about today's visit, request a non-urgent call back, or ask for a work or school excuse. You will get an email in the next two days asking about your experience. I hope that your e-visit has been valuable and will speed your recovery.  I have spent 5 minutes in review of e-visit questionnaire, review and updating patient chart, medical decision making and response to patient.   Chiquita CHRISTELLA Barefoot, NP

## 2024-11-09 NOTE — Progress Notes (Signed)

## 2024-12-04 ENCOUNTER — Other Ambulatory Visit: Payer: Self-pay | Admitting: Family Medicine

## 2024-12-04 DIAGNOSIS — F411 Generalized anxiety disorder: Secondary | ICD-10-CM

## 2024-12-14 ENCOUNTER — Encounter: Payer: Self-pay | Admitting: Family Medicine

## 2024-12-14 ENCOUNTER — Ambulatory Visit: Admitting: Family Medicine

## 2024-12-14 VITALS — BP 120/72 | HR 78 | Temp 98.0°F | Resp 16 | Ht 76.0 in | Wt 262.8 lb

## 2024-12-14 DIAGNOSIS — E669 Obesity, unspecified: Secondary | ICD-10-CM

## 2024-12-14 DIAGNOSIS — F411 Generalized anxiety disorder: Secondary | ICD-10-CM

## 2024-12-14 DIAGNOSIS — E7800 Pure hypercholesterolemia, unspecified: Secondary | ICD-10-CM | POA: Diagnosis not present

## 2024-12-14 DIAGNOSIS — F321 Major depressive disorder, single episode, moderate: Secondary | ICD-10-CM

## 2024-12-14 MED ORDER — ESCITALOPRAM OXALATE 20 MG PO TABS: 20.0000 mg | ORAL_TABLET | Freq: Every day | ORAL | 1 refills | Status: AC

## 2024-12-14 MED ORDER — ROSUVASTATIN CALCIUM 20 MG PO TABS: 20.0000 mg | ORAL_TABLET | Freq: Every day | ORAL | 1 refills | Status: AC

## 2024-12-14 MED ORDER — WEGOVY 1.7 MG/0.75ML ~~LOC~~ SOAJ: 1.7000 mg | SUBCUTANEOUS | 0 refills | Status: AC

## 2024-12-14 MED ORDER — SEMAGLUTIDE-WEIGHT MANAGEMENT 2.4 MG/0.75ML ~~LOC~~ SOAJ: 2.4000 mg | SUBCUTANEOUS | 4 refills | Status: AC

## 2024-12-14 MED ORDER — BUPROPION HCL ER (XL) 150 MG PO TB24: 150.0000 mg | ORAL_TABLET | Freq: Every day | ORAL | 1 refills | Status: AC

## 2024-12-14 NOTE — Progress Notes (Signed)
 Chief Complaint  Patient presents with   Medication Refill    Medication Refill     Subjective: Hyperlipidemia Patient presents for Hyperlipidemia follow up. Currently taking Crestor  20 mg/d and compliance with treatment thus far has been good. He denies myalgias. He is adhering to a healthy diet. Exercise: none No CP or SOB.  The patient is not known to have coexisting coronary artery disease. He had blood work done recently showing an elevated ALT of 56.  Other labs were unremarkable.  GAD/depression Patient currently on Lexapro  20 mg daily, Wellbutrin  XL 150 mg daily.  Most compliant, no adverse effects.  Has been off of Wellbutrin  for 2 weeks as he has not been in for greater than a year.  Noticed mood is suffering a bit.  No homicidal or suicidal ideation.  Not following with a therapist/counselor.  Obesity Patiently is currently taking compounded semaglutide  1.25 mg weekly currently.  Compliant, no adverse effects.  He has never been through a supervised weight loss program otherwise.  His insurance now covers Wegovy  again.  He is interested in going back on the higher dosages.  Diet/exercise as above.  Past Medical History:  Diagnosis Date   GAD (generalized anxiety disorder)    Gout    Hyperlipidemia     Objective: BP 120/72 (BP Location: Left Arm, Patient Position: Sitting)   Pulse 78   Temp 98 F (36.7 C) (Oral)   Resp 16   Ht 6' 4 (1.93 m)   Wt 262 lb 12.8 oz (119.2 kg)   SpO2 97%   BMI 31.99 kg/m  General: Awake, appears stated age HEENT: MMM Heart: RRR, no LE edema, no bruits Lungs: CTAB, no rales, wheezes or rhonchi. No accessory muscle use Psych: Age appropriate judgment and insight, normal affect and mood  Assessment and Plan: Pure hypercholesterolemia - Plan: Hepatic function panel, Lipid panel  Depression, major, single episode, moderate (HCC)  GAD (generalized anxiety disorder) - Plan: buPROPion  (WELLBUTRIN  XL) 150 MG 24 hr tablet, escitalopram   (LEXAPRO ) 20 MG tablet  Obesity (BMI 30-39.9) - Plan: semaglutide -weight management (WEGOVY ) 2.4 MG/0.75ML SOAJ SQ injection, semaglutide -weight management (WEGOVY ) 1.7 MG/0.75ML SOAJ SQ injection  Chronic, stable.  Continue Crestor  20 mg daily.  Counseled on diet and exercise.  Follow-up lab in 6 weeks.  Fasting. 2/3.  Chronic, relatively stable.  Refill Wellbutrin .  Continue Wellbutrin  XL 150 mg daily, Lexapro  20 mg daily. 4.  Chronic, not currently controlled.  Currently on compounded semaglutide  1.25 mg weekly.  Increase to 1.7 mg weekly for 4 weeks and then 2.4 mg weekly.  He will let me know if there are cost issues. F/u in 6 mo. The patient voiced understanding and agreement to the plan.  Mabel [place], DO 12/14/24  11:55 AM

## 2024-12-14 NOTE — Patient Instructions (Addendum)
 Keep the diet clean and stay active.  Let me know if there are cost issues.   Please contact the GI team at: [phone]  Let us  know if you need anything.

## 2024-12-16 ENCOUNTER — Telehealth: Payer: Self-pay

## 2024-12-16 NOTE — Telephone Encounter (Signed)
 PA needed for [facility].

## 2024-12-24 ENCOUNTER — Other Ambulatory Visit (HOSPITAL_COMMUNITY): Payer: Self-pay

## 2024-12-24 ENCOUNTER — Telehealth: Payer: Self-pay

## 2024-12-24 NOTE — Telephone Encounter (Signed)
 Pharmacy Patient Advocate Encounter  Received notification from [facility] MEDICAID that Prior Authorization for Wegovy  1.7mg /0.56ml has been CANCELLED due to member has alternative pharmacy benefits.    PA #/Case ID/Reference #: [id number]

## 2024-12-24 NOTE — Telephone Encounter (Signed)
 Pharmacy Patient Advocate Encounter   Received notification from Pt Calls Messages that prior authorization for Wegovy  1.7mg /0.86ml is required/requested.   Insurance verification completed.   The patient is insured through [facility] MEDICAID.   Per test claim: PA required; PA submitted to above mentioned insurance via Latent Key/confirmation #/EOC [facility] Status is pending

## 2024-12-24 NOTE — Telephone Encounter (Signed)
 Sent pt message letting him know about  Prior Authorization for Wegovy  1.7mg /0.74m not covered.

## 2024-12-24 NOTE — Telephone Encounter (Signed)
 PA submitted to above mentioned insurance via Latent Key/confirmation #/EOC [facility] Status is pending
